# Patient Record
Sex: Male | Born: 1985 | Race: Black or African American | Hispanic: No | Marital: Single | Smoking: Current some day smoker
Health system: Southern US, Community
[De-identification: ages and names within clinical notes are randomized; demographics above are authoritative.]

## PROBLEM LIST (undated history)

## (undated) DIAGNOSIS — I10 Essential (primary) hypertension: Secondary | ICD-10-CM

## (undated) HISTORY — PX: BACK SURGERY: SHX140

---

## 2005-09-27 ENCOUNTER — Emergency Department (HOSPITAL_COMMUNITY): Admission: EM | Admit: 2005-09-27 | Discharge: 2005-09-27 | Payer: Self-pay | Admitting: Emergency Medicine

## 2006-10-22 ENCOUNTER — Emergency Department (HOSPITAL_COMMUNITY): Admission: EM | Admit: 2006-10-22 | Discharge: 2006-10-22 | Payer: Self-pay | Admitting: Emergency Medicine

## 2008-09-04 ENCOUNTER — Emergency Department (HOSPITAL_COMMUNITY): Admission: EM | Admit: 2008-09-04 | Discharge: 2008-09-04 | Payer: Self-pay | Admitting: Emergency Medicine

## 2008-09-21 DIAGNOSIS — F411 Generalized anxiety disorder: Secondary | ICD-10-CM | POA: Insufficient documentation

## 2008-09-21 DIAGNOSIS — R002 Palpitations: Secondary | ICD-10-CM

## 2008-09-29 ENCOUNTER — Encounter (INDEPENDENT_AMBULATORY_CARE_PROVIDER_SITE_OTHER): Payer: Self-pay | Admitting: *Deleted

## 2008-10-26 ENCOUNTER — Encounter (INDEPENDENT_AMBULATORY_CARE_PROVIDER_SITE_OTHER): Payer: Self-pay | Admitting: *Deleted

## 2010-05-26 LAB — CBC
MCHC: 34.2 g/dL (ref 30.0–36.0)
Platelets: 185 10*3/uL (ref 150–400)
RDW: 12.6 % (ref 11.5–15.5)

## 2010-05-26 LAB — POCT CARDIAC MARKERS
CKMB, poc: <1 ng/mL — ABNORMAL LOW (ref 1.0–8.0)
Myoglobin, poc: 54.7 ng/mL (ref 12–200)
Troponin i, poc: <0.05 ng/mL (ref 0.00–0.09)

## 2010-05-26 LAB — POCT I-STAT, CHEM 8
BUN: 9 mg/dL (ref 6–23)
Calcium, Ion: 1.27 mmol/L (ref 1.12–1.32)
Chloride: 107 meq/L (ref 96–112)
Creatinine, Ser: 1.1 mg/dL (ref 0.4–1.5)
Glucose, Bld: 87 mg/dL (ref 70–99)
HCT: 49 % (ref 39.0–52.0)
Hemoglobin: 16.7 g/dL (ref 13.0–17.0)
Potassium: 3.9 meq/L (ref 3.5–5.1)
Sodium: 142 meq/L (ref 135–145)
TCO2: 26 mmol/L (ref 0–100)

## 2010-05-26 LAB — DIFFERENTIAL
Basophils Absolute: 0 K/uL (ref 0.0–0.1)
Basophils Relative: 0 % (ref 0–1)
Eosinophils Absolute: 0.1 K/uL (ref 0.0–0.7)
Eosinophils Relative: 1 % (ref 0–5)
Lymphocytes Relative: 20 % (ref 12–46)
Lymphs Abs: 1.8 K/uL (ref 0.7–4.0)
Monocytes Absolute: 0.5 K/uL (ref 0.1–1.0)
Monocytes Relative: 6 % (ref 3–12)
Neutro Abs: 6.6 K/uL (ref 1.7–7.7)
Neutrophils Relative %: 73 % (ref 43–77)

## 2013-08-11 ENCOUNTER — Encounter (HOSPITAL_COMMUNITY): Payer: Self-pay | Admitting: Emergency Medicine

## 2013-08-11 ENCOUNTER — Emergency Department (HOSPITAL_COMMUNITY)
Admission: EM | Admit: 2013-08-11 | Discharge: 2013-08-11 | Disposition: A | Discharge status: Home / Self Care | Payer: Medicaid Other | Source: Home / Self Care | Attending: Family Medicine | Admitting: Family Medicine

## 2013-08-11 DIAGNOSIS — G8929 Other chronic pain: Secondary | ICD-10-CM

## 2013-08-11 DIAGNOSIS — M5432 Sciatica, left side: Secondary | ICD-10-CM

## 2013-08-11 DIAGNOSIS — M5430 Sciatica, unspecified side: Secondary | ICD-10-CM

## 2013-08-11 DIAGNOSIS — M549 Dorsalgia, unspecified: Secondary | ICD-10-CM

## 2013-08-11 LAB — POCT I-STAT, CHEM 8
BUN: 12 mg/dL (ref 6–23)
CHLORIDE: 103 meq/L (ref 96–112)
CREATININE: 0.9 mg/dL (ref 0.50–1.35)
Calcium, Ion: 1.24 mmol/L — ABNORMAL HIGH (ref 1.12–1.23)
GLUCOSE: 93 mg/dL (ref 70–99)
HEMATOCRIT: 50 % (ref 39.0–52.0)
Hemoglobin: 17 g/dL (ref 13.0–17.0)
POTASSIUM: 3.9 meq/L (ref 3.7–5.3)
SODIUM: 142 meq/L (ref 137–147)
TCO2: 26 mmol/L (ref 0–100)

## 2013-08-11 MED ORDER — PREDNISONE 10 MG PO KIT: PACK | ORAL | Status: DC

## 2013-08-11 MED ORDER — LISINOPRIL 5 MG PO TABS: 5.0000 mg | ORAL_TABLET | Freq: Every day | ORAL | Status: DC

## 2013-08-11 MED ORDER — TRAMADOL HCL 50 MG PO TABS: 50.0000 mg | ORAL_TABLET | Freq: Two times a day (BID) | ORAL | Status: DC | PRN

## 2013-08-11 MED ORDER — CYCLOBENZAPRINE HCL 5 MG PO TABS: 5.0000 mg | ORAL_TABLET | Freq: Every evening | ORAL | Status: DC | PRN

## 2013-08-11 NOTE — ED Notes (Signed)
C/o back pain States does have hx of arthritis in his back States he has ran out of pain medication

## 2013-08-11 NOTE — Discharge Instructions (Signed)
Thank you for coming in today. You should return in about 1 month or follow up with a doctor in 1 month for lab recheck.  Follow up with a primary doctor for back pain.  Come back or go to the emergency room if you notice new weakness new numbness problems walking or bowel or bladder problems.   Sciatica Sciatica is pain, weakness, numbness, or tingling along the path of the sciatic nerve. The nerve starts in the lower back and runs down the back of each leg. The nerve controls the muscles in the lower leg and in the back of the knee, while also providing sensation to the back of the thigh, lower leg, and the sole of your foot. Sciatica is a symptom of another medical condition. For instance, nerve damage or certain conditions, such as a herniated disk or bone spur on the spine, pinch or put pressure on the sciatic nerve. This causes the pain, weakness, or other sensations normally associated with sciatica. Generally, sciatica only affects one side of the body. CAUSES   Herniated or slipped disc.  Degenerative disk disease.  A pain disorder involving the narrow muscle in the buttocks (piriformis syndrome).  Pelvic injury or fracture.  Pregnancy.  Tumor (rare). SYMPTOMS  Symptoms can vary from mild to very severe. The symptoms usually travel from the low back to the buttocks and down the back of the leg. Symptoms can include:  Mild tingling or dull aches in the lower back, leg, or hip.  Numbness in the back of the calf or sole of the foot.  Burning sensations in the lower back, leg, or hip.  Sharp pains in the lower back, leg, or hip.  Leg weakness.  Severe back pain inhibiting movement. These symptoms may get worse with coughing, sneezing, laughing, or prolonged sitting or standing. Also, being overweight may worsen symptoms. DIAGNOSIS  Your caregiver will perform a physical exam to look for common symptoms of sciatica. He or she may ask you to do certain movements or activities  that would trigger sciatic nerve pain. Other tests may be performed to find the cause of the sciatica. These may include:  Blood tests.  X-rays.  Imaging tests, such as an MRI or CT scan. TREATMENT  Treatment is directed at the cause of the sciatic pain. Sometimes, treatment is not necessary and the pain and discomfort goes away on its own. If treatment is needed, your caregiver may suggest:  Over-the-counter medicines to relieve pain.  Prescription medicines, such as anti-inflammatory medicine, muscle relaxants, or narcotics.  Applying heat or ice to the painful area.  Steroid injections to lessen pain, irritation, and inflammation around the nerve.  Reducing activity during periods of pain.  Exercising and stretching to strengthen your abdomen and improve flexibility of your spine. Your caregiver may suggest losing weight if the extra weight makes the back pain worse.  Physical therapy.  Surgery to eliminate what is pressing or pinching the nerve, such as a bone spur or part of a herniated disk. HOME CARE INSTRUCTIONS   Only take over-the-counter or prescription medicines for pain or discomfort as directed by your caregiver.  Apply ice to the affected area for 20 minutes, 3-4 times a day for the first 48-72 hours. Then try heat in the same way.  Exercise, stretch, or perform your usual activities if these do not aggravate your pain.  Attend physical therapy sessions as directed by your caregiver.  Keep all follow-up appointments as directed by your caregiver.  Do not wear high heels or shoes that do not provide proper support.  Check your mattress to see if it is too soft. A firm mattress may lessen your pain and discomfort. SEEK IMMEDIATE MEDICAL CARE IF:   You lose control of your bowel or bladder (incontinence).  You have increasing weakness in the lower back, pelvis, buttocks, or legs.  You have redness or swelling of your back.  You have a burning sensation when  you urinate.  You have pain that gets worse when you lie down or awakens you at night.  Your pain is worse than you have experienced in the past.  Your pain is lasting longer than 4 weeks.  You are suddenly losing weight without reason. MAKE SURE YOU:  Understand these instructions.  Will watch your condition.  Will get help right away if you are not doing well or get worse. Document Released: 01/28/2001 Document Revised: 08/05/2011 Document Reviewed: 06/15/2011 [facility] Patient Information 2015 [place], [place]. This information is not intended to replace advice given to you by your health care provider. Make sure you discuss any questions you have with your health care provider.

## 2013-08-11 NOTE — ED Provider Notes (Signed)
Lee Garcia is a 28 y.o. male who presents to Urgent Care today for chronic back pain and hypertension. Patient moved from [place] recently. He recently obtained Medicaid and is here today to address his back pain. He notes chronic low back pain with radiation to his left leg. He typically has taken his tramadol for this in the past. He denies any weakness numbness bowel bladder dysfunction or difficulty walking.  Additionally patient notes elevated blood pressure. He was on blood pressure medication at home but currently is not taking any. No chest pain palpitations or shortness of breath   History reviewed. No pertinent past medical history. History  Substance Use Topics  . Smoking status: Not on file  . Smokeless tobacco: Not on file  . Alcohol Use: Not on file   ROS as above Medications: No current facility-administered medications for this encounter.   Current Outpatient Prescriptions  Medication Sig Dispense Refill  . cyclobenzaprine (FLEXERIL) 5 MG tablet Take 1 tablet (5 mg total) by mouth at bedtime as needed for muscle spasms.  20 tablet  0  . lisinopril (PRINIVIL,ZESTRIL) 5 MG tablet Take 1 tablet (5 mg total) by mouth daily.  30 tablet  1  . PredniSONE 10 MG KIT 12 day dose pack po  1 kit  0  . traMADol (ULTRAM) 50 MG tablet Take 1 tablet (50 mg total) by mouth every 12 (twelve) hours as needed for severe pain.  30 tablet  0    Exam:  BP 139/88  Pulse 68  Temp(Src) 97.8 F (36.6 C) (Oral)  Resp 18  SpO2 98% Gen: Well NAD HEENT: EOMI,  MMM Lungs: Normal work of breathing. CTABL Heart: RRR no MRG Abd: NABS, Soft. NT, ND Exts: Brisk capillary refill, warm and well perfused.  Back: Nontender to spinal midline. Tender palpation left SI joint. Negative straight leg raise test positive Corky Sox test on the left. Range of motion is intact. Reflexes are intact bilaterally. Normal strength sensation. Normal gait.  Results for orders placed during the hospital encounter  of 08/11/13 (from the past 24 hour(s))  POCT I-STAT, CHEM 8     Status: Abnormal   Collection Time    08/11/13 11:22 AM      Result Value Ref Range   Sodium 142  137 - 147 mEq/L   Potassium 3.9  3.7 - 5.3 mEq/L   Chloride 103  96 - 112 mEq/L   BUN 12  6 - 23 mg/dL   Creatinine, Ser 0.90  0.50 - 1.35 mg/dL   Glucose, Bld 93  70 - 99 mg/dL   Calcium, Ion 1.24 (*) 1.12 - 1.23 mmol/L   TCO2 26  0 - 100 mmol/L   Hemoglobin 17.0  13.0 - 17.0 g/dL   HCT 50.0  39.0 - 52.0 %   No results found.  Assessment and Plan: 28 y.o. male with  1) left-sided lumbago with mild sciatica. Neurologically intact. Plan to treat with prednisone and small amount of tramadol. Encourage followup with primary care provider through [facility]. 2) hypertension: Start low-dose lisinopril. Return to clinic in one month for recheck  Discussed warning signs or symptoms. Please see discharge instructions. Patient expresses understanding.    Gregor Hams, MD 08/11/13 [phone]

## 2013-10-07 ENCOUNTER — Emergency Department (HOSPITAL_COMMUNITY)
Admission: EM | Admit: 2013-10-07 | Discharge: 2013-10-07 | Disposition: A | Discharge status: Home / Self Care | Payer: Medicaid Other | Source: Home / Self Care | Attending: Family Medicine | Admitting: Family Medicine

## 2013-10-07 ENCOUNTER — Encounter (HOSPITAL_COMMUNITY): Payer: Self-pay | Admitting: Emergency Medicine

## 2013-10-07 DIAGNOSIS — I10 Essential (primary) hypertension: Secondary | ICD-10-CM | POA: Diagnosis present

## 2013-10-07 DIAGNOSIS — M545 Low back pain, unspecified: Secondary | ICD-10-CM | POA: Diagnosis present

## 2013-10-07 HISTORY — DX: Essential (primary) hypertension: I10

## 2013-10-07 MED ORDER — HYDROCHLOROTHIAZIDE 12.5 MG PO TABS: 12.5000 mg | ORAL_TABLET | Freq: Every day | ORAL | Status: DC

## 2013-10-07 MED ORDER — DICLOFENAC SODIUM 50 MG PO TBEC: 50.0000 mg | DELAYED_RELEASE_TABLET | Freq: Two times a day (BID) | ORAL | Status: DC | PRN

## 2013-10-07 NOTE — ED Notes (Signed)
Evaluated by physician only

## 2013-10-07 NOTE — Discharge Instructions (Signed)
Thank you for coming in today. Follow up with Dr. Farris Has at [facility]. Follow up with a primary care Dr. Claudie Fisherman back or go to the emergency room if you notice new weakness new numbness problems walking or bowel or bladder problems. PRIMARY CARE Merchant navy officer at [employer] [address]  [place], [place] [phone]  Fax [phone]  Nature conservation officer at [facility] [address]  [place], [place] [phone]  Fax [phone]  Nature conservation officer at [place] / [place] [phone] [place], [place] [phone]  Fax [phone]  [facility] [address]  [place], [place] [phone]  Fax [phone]  [facility] [address] [place] [address] [place], [place] [phone]  Fax [phone]  [facility] [address] [place], [place] [phone]  Fax [phone]   [facility] @ [place] [address] [place] [address] Phone: [phone]   [facility] @ [facility] [address] [place] [address] Phone: [phone]   [facility] @ [place] [address] [place] [address] Phone: [phone]   [facility] @ [place] [address] [place] [address] Phone: [phone]   [facility] @ [place] [address] [place] [address] Phone: [phone] Fax: [phone]   [facility] @ [place] [address] [place] [address] Phone: [phone]     Back Exercises These exercises may help you when beginning to rehabilitate your injury. Your symptoms may resolve with or without further involvement from your physician, physical therapist or athletic trainer. While completing these  exercises, remember:   Restoring tissue flexibility helps normal motion to return to the joints. This allows healthier, less painful movement and activity.  An effective stretch should be held for at least 30 seconds.  A stretch should never be painful. You should only feel a gentle lengthening or release in the stretched tissue. STRETCH - Extension, Prone on Elbows   Lie on your stomach on the floor, a bed will be too soft. Place your palms about shoulder width apart and at the height of your head.  Place your elbows under your shoulders. If this is too painful, stack pillows under your chest.  Allow your body to relax so that your hips drop lower and make contact more completely with the floor.  Hold this position for __________ seconds.  Slowly return to lying flat on the floor. Repeat __________ times. Complete this exercise __________ times per day.  RANGE OF MOTION - Extension, Prone Press Ups   Lie on your stomach on the floor, a bed will be too soft. Place your palms about shoulder width apart and at the height of your head.  Keeping your back as relaxed as possible, slowly straighten your elbows while keeping your hips on the floor. You may adjust the placement of your hands to maximize your comfort. As you gain motion, your hands will come more underneath your shoulders.  Hold this position __________ seconds.  Slowly return to lying flat on the floor. Repeat __________ times. Complete this exercise __________ times per day.  RANGE OF MOTION- Quadruped, Neutral Spine   Assume a hands and knees position on a firm  surface. Keep your hands under your shoulders and your knees under your hips. You may place padding under your knees for comfort.  Drop your head and point your tail bone toward the ground below you. This will round out your low back like an angry cat. Hold this position for __________ seconds.  Slowly lift your head and release your tail bone so that your back  sags into a large arch, like an old horse.  Hold this position for __________ seconds.  Repeat this until you feel limber in your low back.  Now, find your "sweet spot." This will be the most comfortable position somewhere between the two previous positions. This is your neutral spine. Once you have found this position, tense your stomach muscles to support your low back.  Hold this position for __________ seconds. Repeat __________ times. Complete this exercise __________ times per day.  STRETCH - Flexion, Single Knee to Chest   Lie on a firm bed or floor with both legs extended in front of you.  Keeping one leg in contact with the floor, bring your opposite knee to your chest. Hold your leg in place by either grabbing behind your thigh or at your knee.  Pull until you feel a gentle stretch in your low back. Hold __________ seconds.  Slowly release your grasp and repeat the exercise with the opposite side. Repeat __________ times. Complete this exercise __________ times per day.  STRETCH - Hamstrings, Standing  Stand or sit and extend your right / left leg, placing your foot on a chair or foot stool  Keeping a slight arch in your low back and your hips straight forward.  Lead with your chest and lean forward at the waist until you feel a gentle stretch in the back of your right / left knee or thigh. (When done correctly, this exercise requires leaning only a small distance.)  Hold this position for __________ seconds. Repeat __________ times. Complete this stretch __________ times per day. STRENGTHENING - Deep Abdominals, Pelvic Tilt   Lie on a firm bed or floor. Keeping your legs in front of you, bend your knees so they are both pointed toward the ceiling and your feet are flat on the floor.  Tense your lower abdominal muscles to press your low back into the floor. This motion will rotate your pelvis so that your tail bone is scooping upwards rather than pointing at your feet or  into the floor.  With a gentle tension and even breathing, hold this position for __________ seconds. Repeat __________ times. Complete this exercise __________ times per day.  STRENGTHENING - Abdominals, Crunches   Lie on a firm bed or floor. Keeping your legs in front of you, bend your knees so they are both pointed toward the ceiling and your feet are flat on the floor. Cross your arms over your chest.  Slightly tip your chin down without bending your neck.  Tense your abdominals and slowly lift your trunk high enough to just clear your shoulder blades. Lifting higher can put excessive stress on the low back and does not further strengthen your abdominal muscles.  Control your return to the starting position. Repeat __________ times. Complete this exercise __________ times per day.  STRENGTHENING - Quadruped, Opposite UE/LE Lift   Assume a hands and knees position on a firm surface. Keep your hands under your shoulders and your knees under your hips. You may place padding under your knees for comfort.  Find your neutral spine and gently  tense your abdominal muscles so that you can maintain this position. Your shoulders and hips should form a rectangle that is parallel with the floor and is not twisted.  Keeping your trunk steady, lift your right hand no higher than your shoulder and then your left leg no higher than your hip. Make sure you are not holding your breath. Hold this position __________ seconds.  Continuing to keep your abdominal muscles tense and your back steady, slowly return to your starting position. Repeat with the opposite arm and leg. Repeat __________ times. Complete this exercise __________ times per day. Document Released: 02/21/2005 Document Revised: 04/28/2011 Document Reviewed: 05/18/2008 [facility] Patient Information 2015 [place], [place]. This information is not intended to replace advice given to you by your health care provider. Make sure you discuss any  questions you have with your health care provider.

## 2013-10-07 NOTE — ED Provider Notes (Signed)
Lee Garcia is a 28 y.o. male who presents to Urgent Care today for back pain. Patient has chronic back pain. This is been unchanged for months. He has not yet established with a primary care provider or pain specialist for this issue. He denies any significant radiating pain weakness or numbness. He's tried some over-the-counter medications which helps some.  Additionally patient notes continued elevated blood pressure. He did not take the prescribed lisinopril previously as it caused some mild dizziness. He feels well otherwise. No chest pains palpitations or shortness of breath.   Past Medical History  Diagnosis Date  . Hypertension    History  Substance Use Topics  . Smoking status: Not on file  . Smokeless tobacco: Not on file  . Alcohol Use: Not on file   ROS as above Medications: No current facility-administered medications for this encounter.   Current Outpatient Prescriptions  Medication Sig Dispense Refill  . diclofenac (VOLTAREN) 50 MG EC tablet Take 1 tablet (50 mg total) by mouth 2 (two) times daily as needed.  60 tablet  0  . hydrochlorothiazide (HYDRODIURIL) 12.5 MG tablet Take 1 tablet (12.5 mg total) by mouth daily.  30 tablet  0  . [DISCONTINUED] lisinopril (PRINIVIL,ZESTRIL) 5 MG tablet Take 1 tablet (5 mg total) by mouth daily.  30 tablet  1    Exam:  BP 162/95  Pulse 79  Temp(Src) 98.3 F (36.8 C) (Oral)  Resp 16  SpO2 97% Gen: Well NAD HEENT: EOMI,  MMM Lungs: Normal work of breathing. CTABL Heart: RRR no MRG Abd: NABS, Soft. Nondistended, Nontender Exts: Brisk capillary refill, warm and well perfused.  Back: Nontender spinal midline. Tender palpation left lumbar paraspinal. Full range of motion of all of spine. Motor strength reflexes and sensation are intact. Normal gait.  No results found for this or any previous visit (from the past 24 hour(s)). No results found.  Assessment and Plan: 28 y.o. male with  1) chronic low back pain:  Diclofenac. Unable to prescribe narcotics route followup with primary care provider.  2) hypertension: Trial of hydrochlorothiazide. Followup with primary care provider.  Discussed warning signs or symptoms. Please see discharge instructions. Patient expresses understanding.   This note was created using Conservation officer, historic buildingsDragon voice recognition software. Any transcription errors are unintended.    Rodolph BongEvan S Micky Sheller, MD 10/07/13 2114

## 2014-01-10 ENCOUNTER — Emergency Department (HOSPITAL_COMMUNITY)
Admission: EM | Admit: 2014-01-10 | Discharge: 2014-01-10 | Disposition: A | Discharge status: Home / Self Care | Payer: Medicaid Other | Attending: Emergency Medicine | Admitting: Emergency Medicine

## 2014-01-10 ENCOUNTER — Encounter (HOSPITAL_COMMUNITY): Payer: Self-pay | Admitting: Emergency Medicine

## 2014-01-10 DIAGNOSIS — I10 Essential (primary) hypertension: Secondary | ICD-10-CM | POA: Insufficient documentation

## 2014-01-10 DIAGNOSIS — Z720 Tobacco use: Secondary | ICD-10-CM | POA: Insufficient documentation

## 2014-01-10 DIAGNOSIS — K029 Dental caries, unspecified: Secondary | ICD-10-CM | POA: Diagnosis not present

## 2014-01-10 DIAGNOSIS — K088 Other specified disorders of teeth and supporting structures: Secondary | ICD-10-CM | POA: Diagnosis present

## 2014-01-10 DIAGNOSIS — K047 Periapical abscess without sinus: Secondary | ICD-10-CM

## 2014-01-10 DIAGNOSIS — Z79899 Other long term (current) drug therapy: Secondary | ICD-10-CM | POA: Insufficient documentation

## 2014-01-10 MED ORDER — PENICILLIN V POTASSIUM 500 MG PO TABS: 500.0000 mg | ORAL_TABLET | Freq: Four times a day (QID) | ORAL | Status: DC

## 2014-01-10 MED ORDER — PENICILLIN V POTASSIUM 250 MG PO TABS
500.0000 mg | ORAL_TABLET | Freq: Four times a day (QID) | ORAL | Status: DC
  Administered 2014-01-10: 500 mg via ORAL
  Filled 2014-01-10: qty 2

## 2014-01-10 MED ORDER — TRAMADOL HCL 50 MG PO TABS: 50.0000 mg | ORAL_TABLET | Freq: Four times a day (QID) | ORAL | Status: DC

## 2014-01-10 MED ORDER — TRAMADOL HCL 50 MG PO TABS
50.0000 mg | ORAL_TABLET | Freq: Once | ORAL | Status: AC
  Administered 2014-01-10: 50 mg via ORAL
  Filled 2014-01-10: qty 1

## 2014-01-10 NOTE — ED Notes (Signed)
The patient said his pain has been going on for a week now.  He thinks he has an abscessed tooth.  The patient said he cannot eat or sleep from the pain.

## 2014-01-10 NOTE — Discharge Instructions (Signed)
Abscessed Tooth °An abscessed tooth is an infection around your tooth. It may be caused by holes or damage to the tooth (cavity) or a dental disease. An abscessed tooth causes mild to very bad pain in and around the tooth. See your dentist right away if you have tooth or gum pain. °HOME CARE °· Take your medicine as told. Finish it even if you start to feel better. °· Do not drive after taking pain medicine. °· Rinse your mouth (gargle) often with salt water (¼ teaspoon salt in 8 ounces of warm water). °· Do not apply heat to the outside of your face. °GET HELP RIGHT AWAY IF:  °· You have a temperature by mouth above 102° F (38.9° C), not controlled by medicine. °· You have chills and a very bad headache. °· You have problems breathing or swallowing. °· Your mouth will not open. °· You develop puffiness (swelling) on the neck or around the eye. °· Your pain is not helped by medicine. °· Your pain is getting worse instead of better. °MAKE SURE YOU:  °· Understand these instructions. °· Will watch your condition. °· Will get help right away if you are not doing well or get worse. °Document Released: 07/23/2007 Document Revised: 04/28/2011 Document Reviewed: 05/14/2010 °ExitCare® Patient Information ©2015 [employer]. This information is not intended to replace advice given to you by your health care provider. Make sure you discuss any questions you have with your health care provider. ° °Emergency Department Resource Guide °1) Find a Doctor and Pay Out of Pocket °Although you won't have to find out who is covered by your insurance plan, it is a good idea to ask around and get recommendations. You will then need to call the office and see if the doctor you have chosen will accept you as a new patient and what types of options they offer for patients who are self-pay. Some doctors offer discounts or will set up payment plans for their patients who do not have insurance, but you will need to ask so you aren't surprised  when you get to your appointment. ° °2) Contact Your Local Health Department °Not all health departments have doctors that can see patients for sick visits, but many do, so it is worth a call to see if yours does. If you don't know where your local health department is, you can check in your phone book. The CDC also has a tool to help you locate your state's health department, and many state websites also have listings of all of their local health departments. ° °3) Find a Walk-in Clinic °If your illness is not likely to be very severe or complicated, you may want to try a walk in clinic. These are popping up all over the country in pharmacies, drugstores, and shopping centers. They're usually staffed by nurse practitioners or physician assistants that have been trained to treat common illnesses and complaints. They're usually fairly quick and inexpensive. However, if you have serious medical issues or chronic medical problems, these are probably not your best option. ° °No Primary Care Doctor: °- Call [facility] at  832-8000 - they can help you locate a primary care doctor that  accepts your insurance, provides certain services, etc. °- Physician Referral Service- 1-800-533-3463 ° °Chronic Pain Problems: °Organization         Address  Phone   Notes  °[facility]  (336) 297-2271 Patients need to be referred by their primary care doctor.  ° °  Medication Assistance: °Organization         Address  Phone   Notes  °[place] [facility] [address] °[place], [place] [address] (336) 641-8030 --Must be a resident of [place] °-- Must have NO insurance coverage whatsoever (no Medicaid/ Medicare, etc.) °-- The pt. MUST have a primary care doctor that directs their care regularly and follows them in the community °  °[facility]  (866) 331-1348   °[employer]  (888) 892-1162   ° °Agencies that provide inexpensive medical care: °Organization          Address  Phone   Notes  °[facility]  (336) 832-8035   °[facility]    (336) 832-7272   °[facility] [address] °[place], [place] [address] (336) 832-4777   °[facility] [address], °[place] (336) 271-4999   °[facility]    (336) 373-0678   °[facility]    (336) 272-1050   °[facility] ° [address], [place] Phone:  (336) 832-4444, Fax:  (336) 832-4440 Hours of Operation:  9 am - 6 pm, M-F.  Also accepts Medicaid/Medicare and self-pay.  °[facility] ° [address], [place] Phone: (336) 832-3150, Fax: (336) 832-3151. Hours of Operation:  8:30 am - 5:30 pm, M-F.  Also accepts Medicaid and self-pay.  °[facility] [place] [address], [place] Phone: (336) 878-6027   °[facility] [address], [place], [place] (336)723-1848, Ext. 123 Mondays & Thursdays: 7-9 AM.  First 15 patients are seen on a first come, first serve basis. °  ° °Medicaid-accepting [place] Providers: ° °Organization         Address  Phone   Notes  °[facility] [address], [place] (336) 641-2100 Also accepts self-pay patients.  °[facility] [address], [place] ° (336) 856-9996   °[facility] [address], [place] (336) 288-8857   °[facility] [address] [place] [address], [place] (336) 299-7000   °Veita Bland [address], [place]  ° (336) 373-1557 Only accepts [place] Access Medicaid patients after they have their name applied to their card.  ° °Self-Pay (no insurance) in [place]: ° °Organization         Address  Phone   Notes  °Sickle Cell Patients, [facility] [address], [place] (336) 832-1970   °[facility] [address], [place] (336) 832-4400    °[facility] ° [address], [place] (336) 992-4800   °[facility]/Dr. Osei-Bonsu ° [address] [place] [address], [place] or [address], [place] (336) 841-8500 Phone number for both [place] and [place] locations is the same.  °[facility] [address], [place] (336) 299-0000   °[facility] [address] [place] [address], [place] or [address] (336) 852-7530 °(336) 878-2260   °[facility] [address], [place] (336) 350-1642, phone; (336) 294-5005, fax Sees patients 1st and 3rd Saturday of every month.  Must not qualify for public or private insurance (i.e. Medicaid, Medicare, [place] Health Choice, Veterans' Benefits) • Household income should be no more than 200% of the poverty level •The   clinic cannot treat you if you are pregnant or think you are pregnant • Sexually transmitted diseases are not treated at the clinic.  ° ° °Dental Care: °Organization         Address  Phone  Notes  °[facility] [address], [place] (336) 641-6152 Accepts children up to age 21 who are enrolled in Medicaid or [place] Health Choice; pregnant women with a Medicaid card; and children who have applied for Medicaid or [place] Health Choice, but were declined, whose parents can pay a reduced fee at time of service.  °[facility] [place]  [address], [place] (336) 641-7733 Accepts children up to age 21 who are enrolled in Medicaid or [place] Health Choice; pregnant women with a Medicaid card; and children who have applied for Medicaid or [place] Health Choice, but were declined, whose parents can pay a reduced fee at time of service.  °[facility] ° [address], [place] (336) 641-4533 Patients are seen by appointment only. Walk-ins are not accepted. [facility] will see patients 18  years of age and older. °Monday - Tuesday (8am-5pm) °Most Wednesdays (8:30-5pm) °$30 per visit, cash only  °[facility] ° [address], [place] (336) 641-4533 Patients are seen by appointment only. Walk-ins are not accepted. [facility] will see patients 18 years of age and older. °One Wednesday Evening (Monthly: Volunteer Based).  $30 per visit, cash only  °[facility]  (919) 537-3737 for adults; Children under age 4, call Graduate Pediatric Dentistry at (919) [phone]. Children aged 4-14, please call (919) [phone] to request a pediatric application. ° Dental services are provided in all areas of dental care including fillings, crowns and bridges, complete and partial dentures, implants, gum treatment, root canals, and extractions. Preventive care is also provided. Treatment is provided to both adults and children. °Patients are selected via a lottery and there is often a waiting list. °  °[facility] [address], °[place] ° (336) 763-8833 [email or web address] °  °[facility] [address], [place], [place] (336)723-1848, Ext. 123 Second and Fourth Thursday of each month, opens at 6:30 AM; Clinic ends at 9 AM.  Patients are seen on a first-come first-served basis, and a limited number are seen during each clinic.  ° °[facility] ° [address], [place], [place] (336) 723-7904   Eligibility Requirements °You must have lived in [place], [place], or [place] counties for at least the last three months. °  You cannot be eligible for state or federal sponsored healthcare insurance, including Veterans Administration, Medicaid, or Medicare. °  You generally cannot be eligible for healthcare insurance through your employer.  °  How to apply: °Eligibility screenings are held every Tuesday and Wednesday afternoon from 1:00 pm until 4:00 pm. You do not need an appointment for the interview!  °[facility]  [address], [place], [place] 336-631-2330   °[facility]  336-342-8273   °[facility]  336-703-3100   °[facility]  336-570-6415   ° °Behavioral Health Resources in the Community: °Intensive Outpatient Programs °Organization         Address  Phone  Notes  °[facility] [address], [place], [place] 336-878-6098   °[facility] [address], ,   [place] 336-832-9800   °[facility] [address], [place], [place] ° 336-882-2125   °[facility] [address],  °[place], [place] 1-800-853-5163 or 336-641-4981   °Substance Abuse Resources °Organization         Address  Phone  Notes  °[facility]  336-882-2125   °[facility]  336-784-9470   °[facility]  336-285-9073   °[facility]  336-845-3988   °Residential & Outpatient Substance Abuse Program  1-800-659-3381   °Psychological Services °Organization         Address  Phone  Notes  °[facility]  336- 832-9600   °[facility]  336- 378-7881   °[facility] [address], [place] 1-800-853-5163 or 336-641-4981   ° °Mobile Crisis Teams °Organization         Address  Phone  Notes  °[facility], Mobile Crisis Care Unit  1-877-626-1772   °Assertive °[facility] ° [address] [place], [place] 336-834-9664   °Sharon DeEsch [address] °[place] 336-554-5454   ° °Self-Help/Support Groups °Organization         Address  Phone             Notes  °[facility] - variety of support groups  336- 373-1402 Call for more information  °Narcotics Anonymous (NA), [facility] [address], °[place]  2 meetings at this location  ° °Residential Treatment Programs °Organization         Address  Phone  Notes  °[facility] [address],    °[place]   1-866-801-8205   °[facility] ° [address], [place], [place] 704-293-8524   °[facility] [address], [place] 336-845-3988 Admissions: 8am-3pm M-F  °[facility] [address]    °[place], [place] 336-841-1104   °[facility] [address], [place], [place] 336-379-7146   °[facility] [address]  °[place], [place] 336-285-9073   °[facility] Intensive Outpatient [address], [place], [place] 336-852-3033   °[facility] [address]  °[place], [place] 1-877-615-2722 or 336-784-9470   °[facility] [address] [place], [place] 336-227-7417 Accepts Medicaid  °[facility] [address]  °[place] 1-800-659-3381 Substance Abuse/Addiction Treatment  ° °[place] Behavioral Health Resources °Organization         Address  Phone  Notes  °[facility]  (888) 581-9988   °Julie Brannon, PhD [address] [place],    (336) 349-5553 or (336) 951-0000   °[facility]   [address] °[place],  (336) 349-4454   °[facility] [address], [place],  (336) 342-8316 Insurance/Medicaid/sponsorship through [facility]  °[facility] [address]                                    [place],  (336) 342-8316 Therapy/tele-psych/case  °[facility] [address]  ° [place],  (336) 349-2233    °Dr. Arfeen  (336) 349-4544   °[facility] [place]  [employer] [place] [facility] 1) [address], [place] °2) [address], [place] °3)  [address], [place] (336) 349-3220 °(336) 342-7768 ° °(  336) 342-8140   °[place] [facility] (336) 342-1394 or (336) 342-3537 (After Hours)    ° ° °

## 2014-01-10 NOTE — ED Provider Notes (Signed)
CSN: [id number]     Arrival date & time 01/10/14  1942 History   First MD Initiated Contact with Patient 01/10/14 2012     Chief Complaint  Patient presents with  . Dental Pain    The patient said his pain has been going on for a week now.  He thinks he has an abscessed tooth.       (Consider location/radiation/quality/duration/timing/severity/associated sxs/prior Treatment) HPI Comments: Patient with R lower last molar pain and swelling for a while worse over the past week . States has tried to call "some dentist" on his lunch hours without success   Patient is a 28 y.o. male presenting with tooth pain. The history is provided by the patient.  Dental Pain Location:  Lower Lower teeth location:  17/LL 3rd molar Quality:  Pulsating Severity:  Moderate Onset quality:  Gradual Timing:  Constant Progression:  Unchanged Chronicity:  Chronic Context: abscess and dental caries   Relieved by:  Nothing Worsened by:  Cold food/drink and pressure Associated symptoms: gum swelling   Associated symptoms: no facial pain and no facial swelling   Risk factors: lack of dental care     Past Medical History  Diagnosis Date  . Hypertension    Past Surgical History  Procedure Laterality Date  . Back surgery     History reviewed. No pertinent family history. History  Substance Use Topics  . Smoking status: Current Every Day Smoker -- 0.50 packs/day  . Smokeless tobacco: Never Used  . Alcohol Use: Yes    Review of Systems  HENT: Positive for dental problem. Negative for facial swelling.   Respiratory: Negative for shortness of breath.   Cardiovascular: Negative for chest pain.  All other systems reviewed and are negative.     Allergies  Review of patient's allergies indicates no known allergies.  Home Medications   Prior to Admission medications   Medication Sig Start Date End Date Taking? Authorizing Provider  diclofenac (VOLTAREN) 50 MG EC tablet Take 1 tablet (50 mg total)  by mouth 2 (two) times daily as needed. 10/07/13   Rodolph BongEvan S Corey, MD  hydrochlorothiazide (HYDRODIURIL) 12.5 MG tablet Take 1 tablet (12.5 mg total) by mouth daily. 10/07/13   Rodolph BongEvan S Corey, MD  penicillin v potassium (VEETID) 500 MG tablet Take 1 tablet (500 mg total) by mouth 4 (four) times daily. 01/10/14   Arman FilterGail K Garland Smouse, NP  traMADol (ULTRAM) 50 MG tablet Take 1 tablet (50 mg total) by mouth 4 (four) times daily. 01/10/14   Arman FilterGail K Damare Serano, NP   BP 169/104 mmHg  Pulse 110  Temp(Src) 98.7 F (37.1 C) (Oral)  Resp 17  Ht 5\' 5"  (1.651 m)  Wt 242 lb (109.77 kg)  BMI 40.27 kg/m2  SpO2 98% Physical Exam  Constitutional: He is oriented to person, place, and time. He appears well-developed and well-nourished.  HENT:  Head: Normocephalic.  Mouth/Throat:    Eyes: Pupils are equal, round, and reactive to light.  Neck: Normal range of motion.  Cardiovascular: Normal rate.   Pulmonary/Chest: Effort normal.  Musculoskeletal: Normal range of motion.  Neurological: He is alert and oriented to person, place, and time.  Skin: Skin is warm and dry.  Psychiatric: He has a normal mood and affect.  Nursing note and vitals reviewed.   ED Course  Procedures (including critical care time) Labs Review Labs Reviewed - No data to display  Imaging Review No results found.   EKG Interpretation None  MDM   Final diagnoses:  Dental decay  Dental abscess         Arman FilterGail K Angeli Demilio, NP 01/10/14 2033  Elwin MochaBlair Walden, MD 01/10/14 2356

## 2014-04-20 ENCOUNTER — Encounter (HOSPITAL_COMMUNITY): Payer: Self-pay | Admitting: Emergency Medicine

## 2014-04-20 ENCOUNTER — Emergency Department (INDEPENDENT_AMBULATORY_CARE_PROVIDER_SITE_OTHER)
Admission: EM | Admit: 2014-04-20 | Discharge: 2014-04-20 | Disposition: A | Discharge status: Home / Self Care | Payer: Self-pay | Source: Home / Self Care | Attending: Family Medicine | Admitting: Family Medicine

## 2014-04-20 DIAGNOSIS — K047 Periapical abscess without sinus: Secondary | ICD-10-CM

## 2014-04-20 DIAGNOSIS — K029 Dental caries, unspecified: Secondary | ICD-10-CM

## 2014-04-20 MED ORDER — KETOROLAC TROMETHAMINE 60 MG/2ML IM SOLN
INTRAMUSCULAR | Status: AC
  Filled 2014-04-20: qty 2

## 2014-04-20 MED ORDER — CLINDAMYCIN HCL 300 MG PO CAPS: 300.0000 mg | ORAL_CAPSULE | Freq: Three times a day (TID) | ORAL | Status: DC

## 2014-04-20 MED ORDER — KETOROLAC TROMETHAMINE 60 MG/2ML IM SOLN
60.0000 mg | Freq: Once | INTRAMUSCULAR | Status: AC
  Administered 2014-04-20: 60 mg via INTRAMUSCULAR

## 2014-04-20 NOTE — ED Notes (Signed)
Pain has improved, now 6/10

## 2014-04-20 NOTE — ED Provider Notes (Signed)
CSN: [id number]     Arrival date & time 04/20/14  1221 History   First MD Initiated Contact with Patient 04/20/14 1324     Chief Complaint  Patient presents with  . Dental Pain   (Consider location/radiation/quality/duration/timing/severity/associated sxs/prior Treatment) HPI  R lower molar broke off some time ago. Started becoming painful 3 days ago. Acutely worsened last night. Pain woke pt from sleep last night. Tylenol 1000mg  w/o benefit. Pain is constant and is radiating towards neck, temple and ear. Decreased PO due to pain. Worse w/ trying to eat. Denies fevers, CP, SOb, palpitations, HA, nausea, vomiting, nausea.   No longer taking HCTZ: denies CP, palpitations, SOB.    Past Medical History  Diagnosis Date  . Hypertension    Past Surgical History  Procedure Laterality Date  . Back surgery     Family History  Problem Relation Age of Onset  . Diabetes Mother   . Hypertension Mother   . Diabetes Father   . Hypertension Father    History  Substance Use Topics  . Smoking status: Current Every Day Smoker -- 0.25 packs/day  . Smokeless tobacco: Never Used  . Alcohol Use: Yes    Review of Systems  Allergies  Review of patient's allergies indicates no known allergies.  Home Medications   Prior to Admission medications   Medication Sig Start Date End Date Taking? Authorizing Provider  clindamycin (CLEOCIN) 300 MG capsule Take 1 capsule (300 mg total) by mouth 3 (three) times daily. 04/20/14   Ozella Rocksavid J Merrell, MD   BP 164/81 mmHg  Pulse 96  Temp(Src) 98.9 F (37.2 C) (Oral)  Resp 16  SpO2 98% Physical Exam  Constitutional: He appears well-developed and well-nourished.  HENT:  Numerous dental caries, right most posterior mandibular molar broken off at the level of the gums with surrounding swelling and tenderness to palpation. No purulence, no fluctuance. No significant facial swelling.  Eyes: EOM are normal. Pupils are equal, round, and reactive to light.  Neck:  Normal range of motion.  Cardiovascular: Normal rate and normal heart sounds.   No murmur heard. Pulmonary/Chest: Effort normal.  Abdominal: Soft. Bowel sounds are normal.  Musculoskeletal: Normal range of motion. He exhibits no edema or tenderness.  Neurological: He is alert.  Skin: Skin is dry.  Psychiatric: Judgment and thought content normal.    ED Course  Procedures (including critical care time) Labs Review Labs Reviewed - No data to display  Imaging Review No results found.   MDM   1. Dental infection   2. Infected dental carries    Toradol 60 mg IM given in office Start clindamycin 300 3 times a day Daily mouthwash Medicaid list of dentists in [place] provided to patient.  Patient counseled to continue checking blood pressure regularly as he'll likely need to go back onto blood pressure medication.    Precautions given and all questions answered   Shelly Flattenavid Merrell, MD Family Medicine 04/20/2014, 1:46 PM    Ozella Rocksavid J Merrell, MD 04/20/14 1346

## 2014-04-20 NOTE — Discharge Instructions (Signed)
You have a dental infection this will require antibiotics to clear Please call to follow up at a dentist Please take the antibiotics for the full 7 days Please start ibuprofen for pain in 24 hours Please continue tylenol as needed for further pain Please use a daily mouthwash

## 2014-04-20 NOTE — ED Notes (Signed)
Pain for 2 days , but significantly worse since last night.  Pain involves lower, right tooth, pain in jaw and right ear.  Patient knows tooth is broken.

## 2014-04-21 ENCOUNTER — Emergency Department (HOSPITAL_COMMUNITY)
Admission: EM | Admit: 2014-04-21 | Discharge: 2014-04-21 | Disposition: A | Discharge status: Home / Self Care | Payer: Self-pay | Attending: Emergency Medicine | Admitting: Emergency Medicine

## 2014-04-21 ENCOUNTER — Encounter (HOSPITAL_COMMUNITY): Payer: Self-pay | Admitting: Family Medicine

## 2014-04-21 DIAGNOSIS — K029 Dental caries, unspecified: Secondary | ICD-10-CM | POA: Insufficient documentation

## 2014-04-21 DIAGNOSIS — K002 Abnormalities of size and form of teeth: Secondary | ICD-10-CM | POA: Insufficient documentation

## 2014-04-21 DIAGNOSIS — Z792 Long term (current) use of antibiotics: Secondary | ICD-10-CM | POA: Insufficient documentation

## 2014-04-21 DIAGNOSIS — I10 Essential (primary) hypertension: Secondary | ICD-10-CM | POA: Insufficient documentation

## 2014-04-21 DIAGNOSIS — Z720 Tobacco use: Secondary | ICD-10-CM | POA: Insufficient documentation

## 2014-04-21 DIAGNOSIS — K047 Periapical abscess without sinus: Secondary | ICD-10-CM | POA: Insufficient documentation

## 2014-04-21 LAB — COMPREHENSIVE METABOLIC PANEL
ALK PHOS: 89 U/L (ref 39–117)
ALT: 26 U/L (ref 0–53)
ANION GAP: 11 (ref 5–15)
AST: 25 U/L (ref 0–37)
Albumin: 4.1 g/dL (ref 3.5–5.2)
BILIRUBIN TOTAL: 0.5 mg/dL (ref 0.3–1.2)
BUN: 9 mg/dL (ref 6–23)
CHLORIDE: 103 mmol/L (ref 96–112)
CO2: 26 mmol/L (ref 19–32)
Calcium: 9.6 mg/dL (ref 8.4–10.5)
Creatinine, Ser: 0.99 mg/dL (ref 0.50–1.35)
GLUCOSE: 104 mg/dL — AB (ref 70–99)
POTASSIUM: 3.9 mmol/L (ref 3.5–5.1)
Sodium: 140 mmol/L (ref 135–145)
TOTAL PROTEIN: 7 g/dL (ref 6.0–8.3)

## 2014-04-21 LAB — CBC WITH DIFFERENTIAL/PLATELET
BASOS ABS: 0 10*3/uL (ref 0.0–0.1)
BASOS PCT: 0 % (ref 0–1)
EOS ABS: 0.1 10*3/uL (ref 0.0–0.7)
Eosinophils Relative: 1 % (ref 0–5)
HCT: 45.3 % (ref 39.0–52.0)
Hemoglobin: 15.1 g/dL (ref 13.0–17.0)
Lymphocytes Relative: 24 % (ref 12–46)
Lymphs Abs: 2.6 10*3/uL (ref 0.7–4.0)
MCH: 29.2 pg (ref 26.0–34.0)
MCHC: 33.3 g/dL (ref 30.0–36.0)
MCV: 87.5 fL (ref 78.0–100.0)
Monocytes Absolute: 0.7 10*3/uL (ref 0.1–1.0)
Monocytes Relative: 6 % (ref 3–12)
NEUTROS ABS: 7.7 10*3/uL (ref 1.7–7.7)
NEUTROS PCT: 69 % (ref 43–77)
PLATELETS: 222 10*3/uL (ref 150–400)
RBC: 5.18 MIL/uL (ref 4.22–5.81)
RDW: 13 % (ref 11.5–15.5)
WBC: 11.2 10*3/uL — ABNORMAL HIGH (ref 4.0–10.5)

## 2014-04-21 MED ORDER — HYDROCODONE-ACETAMINOPHEN 5-325 MG PO TABS: 1.0000 | ORAL_TABLET | ORAL | Status: DC | PRN

## 2014-04-21 MED ORDER — KETOROLAC TROMETHAMINE 30 MG/ML IJ SOLN
30.0000 mg | Freq: Once | INTRAMUSCULAR | Status: AC
  Administered 2014-04-21: 30 mg via INTRAVENOUS
  Filled 2014-04-21: qty 1

## 2014-04-21 MED ORDER — TRAMADOL HCL 50 MG PO TABS: 50.0000 mg | ORAL_TABLET | Freq: Four times a day (QID) | ORAL | Status: DC | PRN

## 2014-04-21 MED ORDER — CLINDAMYCIN PHOSPHATE 600 MG/50ML IV SOLN
600.0000 mg | Freq: Once | INTRAVENOUS | Status: AC
  Administered 2014-04-21: 600 mg via INTRAVENOUS
  Filled 2014-04-21: qty 50

## 2014-04-21 MED ORDER — SODIUM CHLORIDE 0.9 % IV BOLUS (SEPSIS)
1000.0000 mL | Freq: Once | INTRAVENOUS | Status: AC
Start: 2014-04-21 — End: 2014-04-21
  Administered 2014-04-21: 1000 mL via INTRAVENOUS

## 2014-04-21 NOTE — ED Notes (Signed)
Pt here for worsening dental pain and abscess after being in abx x 2 days. Pt tach at triage around 130. sts he has a pocket in mouth with white stuff and neck swelling.
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
## 2014-04-21 NOTE — Discharge Instructions (Signed)
Dental Abscess °A dental abscess is a collection of infected fluid (pus) from a bacterial infection in the inner part of the tooth (pulp). It usually occurs at the end of the tooth's root.  °CAUSES  °· Severe tooth decay. °· Trauma to the tooth that allows bacteria to enter into the pulp, such as a broken or chipped tooth. °SYMPTOMS  °· Severe pain in and around the infected tooth. °· Swelling and redness around the abscessed tooth or in the mouth or face. °· Tenderness. °· Pus drainage. °· Bad breath. °· Bitter taste in the mouth. °· Difficulty swallowing. °· Difficulty opening the mouth. °· Nausea. °· Vomiting. °· Chills. °· Swollen neck glands. °DIAGNOSIS  °· A medical and dental history will be taken. °· An examination will be performed by tapping on the abscessed tooth. °· X-rays may be taken of the tooth to identify the abscess. °TREATMENT °The goal of treatment is to eliminate the infection. You may be prescribed antibiotic medicine to stop the infection from spreading. A root canal may be performed to save the tooth. If the tooth cannot be saved, it may be pulled (extracted) and the abscess may be drained.  °HOME CARE INSTRUCTIONS °· Only take over-the-counter or prescription medicines for pain, fever, or discomfort as directed by your caregiver. °· Rinse your mouth (gargle) often with salt water (¼ tsp salt in 8 oz [250 ml] of warm water) to relieve pain or swelling. °· Do not drive after taking pain medicine (narcotics). °· Do not apply heat to the outside of your face. °· Return to your dentist for further treatment as directed. °SEEK MEDICAL CARE IF: °· Your pain is not helped by medicine. °· Your pain is getting worse instead of better. °SEEK IMMEDIATE MEDICAL CARE IF: °· You have a fever or persistent symptoms for more than 2-3 days. °· You have a fever and your symptoms suddenly get worse. °· You have chills or a very bad headache. °· You have problems breathing or swallowing. °· You have trouble  opening your mouth. °· You have swelling in the neck or around the eye. °Document Released: 02/03/2005 Document Revised: 10/29/2011 Document Reviewed: 05/14/2010 °ExitCare® Patient Information ©2015 [employer]. This information is not intended to replace advice given to you by your health care provider. Make sure you discuss any questions you have with your health care provider. ° ° °Emergency Department Resource Guide °1) Find a Doctor and Pay Out of Pocket °Although you won't have to find out who is covered by your insurance plan, it is a good idea to ask around and get recommendations. You will then need to call the office and see if the doctor you have chosen will accept you as a new patient and what types of options they offer for patients who are self-pay. Some doctors offer discounts or will set up payment plans for their patients who do not have insurance, but you will need to ask so you aren't surprised when you get to your appointment. ° °2) Contact Your Local Health Department °Not all health departments have doctors that can see patients for sick visits, but many do, so it is worth a call to see if yours does. If you don't know where your local health department is, you can check in your phone book. The CDC also has a tool to help you locate your state's health department, and many state websites also have listings of all of their local health departments. ° °3) Find a Walk-in   Clinic °If your illness is not likely to be very severe or complicated, you may want to try a walk in clinic. These are popping up all over the country in pharmacies, drugstores, and shopping centers. They're usually staffed by nurse practitioners or physician assistants that have been trained to treat common illnesses and complaints. They're usually fairly quick and inexpensive. However, if you have serious medical issues or chronic medical problems, these are probably not your best option. ° °No Primary Care Doctor: °- Call  [facility] at  832-8000 - they can help you locate a primary care doctor that  accepts your insurance, provides certain services, etc. °- Physician Referral Service- 1-800-533-3463 ° °Chronic Pain Problems: °Organization         Address  Phone   Notes  °[facility]  (336) 297-2271 Patients need to be referred by their primary care doctor.  ° °Medication Assistance: °Organization         Address  Phone   Notes  °[place] [facility] [address] °[place], [place] [address] (336) 641-8030 --Must be a resident of [place] °-- Must have NO insurance coverage whatsoever (no Medicaid/ Medicare, etc.) °-- The pt. MUST have a primary care doctor that directs their care regularly and follows them in the community °  °[facility]  (866) 331-1348   °[employer]  (888) 892-1162   ° °Agencies that provide inexpensive medical care: °Organization         Address  Phone   Notes  °[facility]  (336) 832-8035   °[facility]    (336) 832-7272   °[facility] [address] °[place], [place] [address] (336) 832-4777   °[facility] [address], °[place] (336) 271-4999   °[facility]    (336) 373-0678   °[facility]    (336) 272-1050   °[facility] ° [address], [place] Phone:  (336) 832-4444, Fax:  (336) 832-4440 Hours of Operation:  9 am - 6 pm, M-F.  Also accepts Medicaid/Medicare and self-pay.  °[facility] ° [address], [place] Phone: (336) 832-3150, Fax: (336) 832-3151. Hours of Operation:  8:30 am - 5:30 pm, M-F.  Also accepts Medicaid and self-pay.  °[facility] [place] [address], [place] Phone: (336) 878-6027   °[facility] [address], [place], [place] (336)723-1848, Ext. 123 Mondays & Thursdays: 7-9 AM.  First 15 patients are seen on a first come, first serve  basis. °  ° °Medicaid-accepting [place] Providers: ° °Organization         Address  Phone   Notes  °[facility] [address], [place] (336) 641-2100 Also accepts self-pay patients.  °[facility] [address], [place] ° (336) 856-9996   °[facility] [address], [place] (336) 288-8857   °[facility] [address] [place] [address], [place] (336) 299-7000   °Veita Bland [address], [place]  ° (336) 373-1557 Only accepts [place] Access Medicaid patients after they have their name applied to their card.  ° °Self-Pay (no insurance) in [place]: ° °Organization         Address  Phone   Notes  °Sickle Cell Patients, [facility] [address], [place] (  336) 832-1970   °[facility] [address], [place] (336) 832-4400   °[facility] ° [address],  (336) 992-4800   °[facility]/Dr. Osei-Bonsu ° [address] [place] [address], [place] or [address], [place] (336) 841-8500 Phone number for both [place] and [place] locations is the same.  °[facility] [address], [place] (336) 299-0000   °[facility] [address] [place] [address], [place] or [address] (336) 852-7530 °(336) 878-2260   °[facility] [address], [place] (336) 350-1642, phone; (336) 294-5005, fax Sees patients 1st and 3rd Saturday of every month.  Must not qualify for public or private insurance (i.e. Medicaid, Medicare, [place] Health Choice, Veterans' Benefits) • Household income should be no more than 200% of the poverty level •The clinic cannot treat you if you are pregnant or think you are pregnant • Sexually transmitted diseases are not treated at the clinic.  ° ° °Dental Care: °Organization         Address  Phone  Notes  °[place]  [facility] [address], [place] (336) 641-6152 Accepts children up to age 21 who are enrolled in Medicaid or [place] Health Choice; pregnant women with a Medicaid card; and children who have applied for Medicaid or [place] Health Choice, but were declined, whose parents can pay a reduced fee at time of service.  °[facility] [place]  [address], [place] (336) 641-7733 Accepts children up to age 21 who are enrolled in Medicaid or [place] Health Choice; pregnant women with a Medicaid card; and children who have applied for Medicaid or [place] Health Choice, but were declined, whose parents can pay a reduced fee at time of service.  °[facility] ° [address], [place] (336) 641-4533 Patients are seen by appointment only. Walk-ins are not accepted. [facility] will see patients 18 years of age and older. °Monday - Tuesday (8am-5pm) °Most Wednesdays (8:30-5pm) °$30 per visit, cash only  °[facility] ° [address], [place] (336) 641-4533 Patients are seen by appointment only. Walk-ins are not accepted. [facility] will see patients 18 years of age and older. °One Wednesday Evening (Monthly: Volunteer Based).  $30 per visit, cash only  °[facility]  (919) 537-3737 for adults; Children under age 4, call Graduate Pediatric Dentistry at ([phone]) [phone]. Children aged 4-14, please call (919) [phone] to request a pediatric application. ° Dental services are provided in all areas of dental care including fillings, crowns and bridges, complete and partial dentures, implants, gum treatment, root canals, and extractions. Preventive care is also provided. Treatment is provided to both adults and children. °Patients are selected via a lottery and there is often a waiting list. °  °[facility] [address], ° ° (336) 763-8833  [email or web address].[email or web address] °  °[facility] [address], [place], [place] (336)723-1848, Ext. 123 Second and Fourth Thursday of each month, opens at 6:30 AM; Clinic ends at 9 AM.  Patients are seen on a first-come first-served basis, and a limited number are seen during each clinic.  ° °[facility] ° [address], [place], [place] (336) 723-7904   Eligibility Requirements °You must have lived in [place], [place], or [place] counties   for at least the last three months. °  You cannot be eligible for state or federal sponsored healthcare insurance, including Veterans Administration, Medicaid, or Medicare. °  You generally cannot be eligible for healthcare insurance through your employer.  °  How to apply: °Eligibility screenings are held every Tuesday and Wednesday afternoon from 1:00 pm until 4:00 pm. You do not need an appointment for the interview!  °[facility] [address], [place], [place] 336-631-2330   °[facility]  336-342-8273   °[facility]  336-703-3100   °[facility]  336-570-6415   ° °Behavioral Health Resources in the Community: °Intensive Outpatient Programs °Organization         Address  Phone  Notes  °[facility] [address], [place], [place] 336-878-6098   °[facility] [address], [place], [place] 336-832-9800   °[facility] [address], [place], [place] ° 336-882-2125   °[facility] [address],  °[place], [place] 1-800-853-5163 or 336-641-4981   °Substance Abuse Resources °Organization         Address  Phone  Notes  °[facility]  336-882-2125   °[facility]  336-784-9470   °[facility]  336-285-9073   °[facility]  336-845-3988   °Residential & Outpatient Substance Abuse Program  1-800-659-3381   °Psychological Services °Organization          Address  Phone  Notes  ° Health  336- 832-9600   °[facility]  336- 378-7881   °[facility] [address], [place] 1-800-853-5163 or 336-641-4981   ° °Mobile Crisis Teams °Organization         Address  Phone  Notes  °[facility], Mobile Crisis Care Unit  1-877-626-1772   °Assertive °[facility] ° [address] [address] [place], [place] 336-834-9664   °Sharon DeEsch [address] °[place] 336-554-5454   ° °Self-Help/Support Groups °Organization         Address  Phone             Notes  °[facility] - variety of support groups  336- 373-1402 Call for more information  °Narcotics Anonymous (NA), [facility] [address], °[place]  2 meetings at this location  ° °Residential Treatment Programs °Organization         Address  Phone  Notes  °[facility] [address],    °[place]  1-866-801-8205   °[facility] ° [address], [place], [place] 704-293-8524   °[facility] [address], [place] 336-845-3988 Admissions: 8am-3pm M-F  °[facility] [address]    °[place], [place] 336-841-1104   °[facility] [address], [place], [place] 336-379-7146   °[facility] [address]  °[place], [place] 336-285-9073   °[facility] - Intensive Outpatient [address], [place], [place] 336-852-3033   °[facility] [address]  °[place], [place] 1-877-615-2722 or 336-784-9470   °[facility] [address] [place], [place] 336-227-7417 Accepts Medicaid  °[facility] [address]  °  1-800-659-3381 Substance Abuse/Addiction Treatment  ° °[place] Behavioral Health Resources °Organization         Address  Phone  Notes  °[facility]  (888)   581-9988   °Julie Brannon, PhD [address] [place], [place]   (336) 349-5553 or (336) 951-0000   °[facility]   [address] °[place], [place] (336) 349-4454   °[facility] [address], [place], [place] (336) 342-8316 Insurance/Medicaid/sponsorship through [facility]  °[facility] [address]                                    [place], [place] (336) 342-8316 Therapy/tele-psych/case  °[facility] [address]  ° [place], [place] (336) 349-2233    °Dr. Arfeen  (336) 349-4544   °[facility] [place]  [employer] [place] [facility] 1) [address],  °2) [address], [place] °3)  [address], [place] (336) 349-3220 °(336) 342-7768 ° °(336) 342-8140   °[place] [facility] (336) 342-1394 or (336) 342-3537 (After Hours)    ° ° ° °

## 2014-11-12 ENCOUNTER — Emergency Department (HOSPITAL_COMMUNITY)
Admission: EM | Admit: 2014-11-12 | Discharge: 2014-11-12 | Disposition: A | Discharge status: Home / Self Care | Payer: Medicaid Other | Attending: Emergency Medicine | Admitting: Emergency Medicine

## 2014-11-12 ENCOUNTER — Encounter (HOSPITAL_COMMUNITY): Payer: Self-pay | Admitting: Emergency Medicine

## 2014-11-12 DIAGNOSIS — Z720 Tobacco use: Secondary | ICD-10-CM | POA: Insufficient documentation

## 2014-11-12 DIAGNOSIS — Z792 Long term (current) use of antibiotics: Secondary | ICD-10-CM | POA: Insufficient documentation

## 2014-11-12 DIAGNOSIS — I10 Essential (primary) hypertension: Secondary | ICD-10-CM | POA: Insufficient documentation

## 2014-11-12 DIAGNOSIS — K047 Periapical abscess without sinus: Secondary | ICD-10-CM | POA: Insufficient documentation

## 2014-11-12 DIAGNOSIS — K0889 Other specified disorders of teeth and supporting structures: Secondary | ICD-10-CM

## 2014-11-12 DIAGNOSIS — K029 Dental caries, unspecified: Secondary | ICD-10-CM

## 2014-11-12 MED ORDER — NAPROXEN 500 MG PO TABS: 500.0000 mg | ORAL_TABLET | Freq: Two times a day (BID) | ORAL | Status: DC

## 2014-11-12 MED ORDER — AMOXICILLIN 500 MG PO CAPS
500.0000 mg | ORAL_CAPSULE | Freq: Three times a day (TID) | ORAL | Status: DC
Start: 2014-11-12 — End: 2015-04-22

## 2014-11-12 NOTE — Discharge Instructions (Signed)
Take antibiotic as directed for pain. It is important to complete the entire course of antibiotics. Follow-up with the dentist for ultimate management of your dental pain.  Dental Pain A tooth ache may be caused by cavities (tooth decay). Cavities expose the nerve of the tooth to air and hot or cold temperatures. It may come from an infection or abscess (also called a boil or furuncle) around your tooth. It is also often caused by dental caries (tooth decay). This causes the pain you are having. DIAGNOSIS  Your caregiver can diagnose this problem by exam. TREATMENT   If caused by an infection, it may be treated with medications which kill germs (antibiotics) and pain medications as prescribed by your caregiver. Take medications as directed.  Only take over-the-counter or prescription medicines for pain, discomfort, or fever as directed by your caregiver.  Whether the tooth ache today is caused by infection or dental disease, you should see your dentist as soon as possible for further care. SEEK MEDICAL CARE IF: The exam and treatment you received today has been provided on an emergency basis only. This is not a substitute for complete medical or dental care. If your problem worsens or new problems (symptoms) appear, and you are unable to meet with your dentist, call or return to this location. SEEK IMMEDIATE MEDICAL CARE IF:   You have a fever.  You develop redness and swelling of your face, jaw, or neck.  You are unable to open your mouth.  You have severe pain uncontrolled by pain medicine. MAKE SURE YOU:   Understand these instructions.  Will watch your condition.  Will get help right away if you are not doing well or get worse. Document Released: 02/03/2005 Document Revised: 04/28/2011 Document Reviewed: 09/22/2007 [facility] Patient Information 2015 [place], [place]. This information is not intended to replace advice given to you by your health care provider. Make sure you discuss  any questions you have with your health care provider.  Periodontal Disease Periodontal disease, or gum disease, is a type of oral disease that affects the surrounding and supporting tissues of the teeth. These include the gums (gingivae), ligaments, and tooth socket (alveolar bone). Periodontal disease can affect one tooth or many teeth. If left untreated, it may lead to tooth loss.  CAUSES The main cause of periodontal disease is dental plaque, which contains harmful bacteria. These bacteria can cause the gums to become inflamed and infected. Further progression of the disease can damage the other supporting tissues.  RISK FACTORS  Diabetes.   Smoking and tobacco use.   Genetics.   Hormonal changes of puberty, menopause, and pregnancy.   Stress.   Clenching or grinding your teeth.   Substance abuse.  Poor nutrition.   Diseases that interfere with the body's immune system.   Certain medicines. SIGNS AND SYMPTOMS  Red or swollen gums.  Bad breath that does not go away.  Gums that have pulled away from the teeth.  Gums that bleed easily.  Permanent teeth that are loose or separating.  Pain when chewing.  Changes in the way your teeth fit together.  Sensitive teeth. DIAGNOSIS  A thorough examination of the periodontal tissues will be done by your dentist. X-rays may be needed. Evaluation of your medical history will be needed to see if there are other factors or underlying conditions that may contribute to the disease. TREATMENT The number and types of treatment will vary depending on the extent of the disease. Treatment may include brushing and flossing  only. Further disease progression may necessitate scaling and root planing or even surgery. The main goal is to control the infection. Good oral hygiene at home is necessary for the success of all types of treatment. HOME CARE INSTRUCTIONS   Practice good oral hygiene. This includes flossing and brushing your  teeth every day.   See your dentist regularly, at least 2 times per year.   Stop smoking if you smoke.  Eat a well-balanced diet. SEEK IMMEDIATE DENTAL CARE IF:   You have any signs or symptoms of periodontal disease along with:  Swelling of your face, neck, or jaw.  Inability to open your mouth.  Severe pain uncontrolled by pain medicine.  You have a fever or persistent symptoms for more than 2-3 days.  You have a fever and your symptoms suddenly get worse. Document Released: 02/06/2003 Document Revised: 10/06/2012 Document Reviewed: 07/13/2012 [facility] Patient Information 2015 [place], [place]. This information is not intended to replace advice given to you by your health care provider. Make sure you discuss any questions you have with your health care provider.

## 2014-11-12 NOTE — ED Notes (Signed)
Pt from home c/o dental pain that started yesterday. Pt sts that he went to dentist before but could not afford to have work done. Pt has not other c/o. Pt is A&O and in NAD

## 2014-11-12 NOTE — ED Provider Notes (Signed)
CSN: [id number]     Arrival date & time 11/12/14  1421 History  This chart was scribed for non-physician practitioner Celene Skeen, PA-C working with Lorre Nick, MD by Lyndel Safe, ED Scribe. This patient was seen in room WTR6/WTR6 and the patient's care was started at 2:43 PM.   No chief complaint on file.   The history is provided by the patient. No language interpreter was used.   HPI Comments: Lee Garcia is a 29 y.o. male, with a PMhx of HTN, who presents to the Emergency Department complaining of sudden onset, constant, moderate left, lower dental pain with surrounding gingival swelling onset last night during sleep. The pt has a PMhx of a dental infections and states his current pain feels similar to his pain associated with previous dental infections. He has taken ibuprofen without relief. The pt has not attempted to make an appointment with a dentist. He was able to eat breakfast but reports he cannot chew on his left side secondary to pain. Denies facial swelling, fevers, or trouble swallowing.   Past Medical History  Diagnosis Date  . Hypertension    Past Surgical History  Procedure Laterality Date  . Back surgery     Family History  Problem Relation Age of Onset  . Diabetes Mother   . Hypertension Mother   . Diabetes Father   . Hypertension Father    Social History  Substance Use Topics  . Smoking status: Current Every Day Smoker -- 0.25 packs/day  . Smokeless tobacco: Never Used  . Alcohol Use: Yes    Review of Systems  Constitutional: Negative for fever.  HENT: Positive for dental problem ( left, lower). Negative for facial swelling and trouble swallowing.   All other systems reviewed and are negative.  Allergies  Review of patient's allergies indicates no known allergies.  Home Medications   Prior to Admission medications   Medication Sig Start Date End Date Taking? Authorizing Provider  amoxicillin (AMOXIL) 500 MG capsule Take 1 capsule (500 mg  total) by mouth 3 (three) times daily. 11/12/14   Robyn M Hess, PA-C  clindamycin (CLEOCIN) 300 MG capsule Take 1 capsule (300 mg total) by mouth 3 (three) times daily. 04/20/14   Ozella Rocks, MD  HYDROcodone-acetaminophen (NORCO/VICODIN) 5-325 MG per tablet Take 1-2 tablets by mouth every 4 (four) hours as needed for moderate pain. 04/21/14   Gilda Crease, MD  naproxen (NAPROSYN) 500 MG tablet Take 1 tablet (500 mg total) by mouth 2 (two) times daily. 11/12/14   Kathrynn Speed, PA-C  traMADol (ULTRAM) 50 MG tablet Take 1 tablet (50 mg total) by mouth every 6 (six) hours as needed. 04/21/14   Ozella Rocks, MD   BP 169/87 mmHg  Pulse 89  Temp(Src) 98.7 F (37.1 C) (Oral)  Resp 18  SpO2 98% Physical Exam  Constitutional: He is oriented to person, place, and time. He appears well-developed and well-nourished. No distress.  HENT:  Head: Normocephalic and atraumatic.  Mouth/Throat: Uvula is midline and oropharynx is clear and moist. Abnormal dentition. Dental caries present. No dental abscesses.    Eyes: Conjunctivae and EOM are normal.  Neck: Normal range of motion. Neck supple.  Cardiovascular: Normal rate, regular rhythm and normal heart sounds.   Pulmonary/Chest: Effort normal and breath sounds normal.  Musculoskeletal: Normal range of motion. He exhibits no edema.  Neurological: He is alert and oriented to person, place, and time.  Skin: Skin is warm and dry.  Psychiatric: He has  a normal mood and affect. His behavior is normal.  Nursing note and vitals reviewed.   ED Course  Procedures  DIAGNOSTIC STUDIES: Oxygen Saturation is 98% on RA, normal by my interpretation.    COORDINATION OF CARE: 2:48 PM Discussed treatment plan with pt at bedside which includes to prescribe antibiotics and give dental referral. Pt agreed to plan. Will    MDM   Final diagnoses:  Dental infection  Tooth decay  Pain, dental   Dental pain with associated infection. No evidence of dental  abscess or evidence of Ludwig angina. Treat with amoxicillin. Naproxen for pain. Follow-up with dentist. Resources given. Stable for discharge. Return precautions given. Patient states understanding of treatment care plan and is agreeable.  I personally performed the services described in this documentation, which was scribed in my presence. The recorded information has been reviewed and is accurate.  Kathrynn Speed, PA-C 11/12/14 1500  Lorre Nick, MD 11/14/14 1246

## 2015-03-27 ENCOUNTER — Encounter (HOSPITAL_COMMUNITY): Payer: Self-pay | Admitting: *Deleted

## 2015-03-27 ENCOUNTER — Emergency Department (INDEPENDENT_AMBULATORY_CARE_PROVIDER_SITE_OTHER)
Admission: EM | Admit: 2015-03-27 | Discharge: 2015-03-27 | Disposition: A | Discharge status: Home / Self Care | Payer: 59 | Source: Home / Self Care | Attending: Family Medicine | Admitting: Family Medicine

## 2015-03-27 DIAGNOSIS — K047 Periapical abscess without sinus: Secondary | ICD-10-CM | POA: Diagnosis not present

## 2015-03-27 MED ORDER — DICLOFENAC POTASSIUM 50 MG PO TABS: 50.0000 mg | ORAL_TABLET | Freq: Three times a day (TID) | ORAL | Status: DC

## 2015-03-27 MED ORDER — CLINDAMYCIN HCL 300 MG PO CAPS: 300.0000 mg | ORAL_CAPSULE | Freq: Three times a day (TID) | ORAL | Status: DC

## 2015-03-27 NOTE — ED Notes (Signed)
Pt  Has  A  r  Upper toothache    Since  Yesterday    Airway is  Intact         Symptoms  Not  releived  By OTC   meds

## 2015-03-27 NOTE — ED Provider Notes (Signed)
CSN: [id number]     Arrival date & time 03/27/15  1645 History   First MD Initiated Contact with Patient 03/27/15 1818     Chief Complaint  Patient presents with  . Dental Pain   (Consider location/radiation/quality/duration/timing/severity/associated sxs/prior Treatment) Patient is a 30 y.o. male presenting with tooth pain. The history is provided by the patient.  Dental Pain Location:  Lower Lower teeth location:  31/RL 2nd molar Quality:  Throbbing and shooting Severity:  Moderate Onset quality:  Gradual Duration:  1 month Timing:  Intermittent Progression:  Waxing and waning Chronicity:  Chronic Context: abscess, dental caries and poor dentition   Context comment:  Seen by dentist and referred to oral surg --but pt doesn't have $900 for care, comes here. Associated symptoms: gum swelling   Associated symptoms: no facial swelling and no fever   Risk factors: lack of dental care, periodontal disease and smoking     Past Medical History  Diagnosis Date  . Hypertension    Past Surgical History  Procedure Laterality Date  . Back surgery     Family History  Problem Relation Age of Onset  . Diabetes Mother   . Hypertension Mother   . Diabetes Father   . Hypertension Father    Social History  Substance Use Topics  . Smoking status: Current Every Day Smoker -- 0.25 packs/day  . Smokeless tobacco: Never Used  . Alcohol Use: Yes    Review of Systems  Constitutional: Negative.  Negative for fever.  HENT: Positive for dental problem. Negative for facial swelling.   All other systems reviewed and are negative.   Allergies  Review of patient's allergies indicates no known allergies.  Home Medications   Prior to Admission medications   Medication Sig Start Date End Date Taking? Authorizing Provider  amoxicillin (AMOXIL) 500 MG capsule Take 1 capsule (500 mg total) by mouth 3 (three) times daily. 11/12/14   Robyn M Hess, PA-C  clindamycin (CLEOCIN) 300 MG capsule Take 1  capsule (300 mg total) by mouth 3 (three) times daily. 03/27/15   Linna Hoff, MD  diclofenac (CATAFLAM) 50 MG tablet Take 1 tablet (50 mg total) by mouth 3 (three) times daily. 03/27/15   Linna Hoff, MD  HYDROcodone-acetaminophen (NORCO/VICODIN) 5-325 MG per tablet Take 1-2 tablets by mouth every 4 (four) hours as needed for moderate pain. 04/21/14   Gilda Crease, MD  naproxen (NAPROSYN) 500 MG tablet Take 1 tablet (500 mg total) by mouth 2 (two) times daily. 11/12/14   Kathrynn Speed, PA-C  traMADol (ULTRAM) 50 MG tablet Take 1 tablet (50 mg total) by mouth every 6 (six) hours as needed. 04/21/14   Ozella Rocks, MD   Meds Ordered and Administered this Visit  Medications - No data to display  BP 158/82 mmHg  Pulse 74  Temp(Src) 98.6 F (37 C) (Oral)  Resp 16  SpO2 98% No data found.   Physical Exam  Constitutional: He appears well-developed and well-nourished. No distress.  HENT:  Mouth/Throat: Uvula is midline, oropharynx is clear and moist and mucous membranes are normal. Abnormal dentition. Dental abscesses present. No uvula swelling.    Lymphadenopathy:    He has cervical adenopathy.  Nursing note and vitals reviewed.   ED Course  Procedures (including critical care time)  Labs Review Labs Reviewed - No data to display  Imaging Review No results found.   Visual Acuity Review  Right Eye Distance:   Left Eye Distance:  Bilateral Distance:    Right Eye Near:   Left Eye Near:    Bilateral Near:         MDM   1. Abscess, dental    Meds ordered this encounter  Medications  . clindamycin (CLEOCIN) 300 MG capsule    Sig: Take 1 capsule (300 mg total) by mouth 3 (three) times daily.    Dispense:  21 capsule    Refill:  0  . diclofenac (CATAFLAM) 50 MG tablet    Sig: Take 1 tablet (50 mg total) by mouth 3 (three) times daily.    Dispense:  30 tablet    Refill:  0       Linna Hoff, MD 03/27/15 [phone]

## 2015-03-27 NOTE — Discharge Instructions (Signed)
Take medicine as prescribed, see your dentist as soon as possible °

## 2015-04-22 ENCOUNTER — Emergency Department (HOSPITAL_COMMUNITY)
Admission: EM | Admit: 2015-04-22 | Discharge: 2015-04-22 | Disposition: A | Discharge status: Home / Self Care | Payer: 59 | Attending: Emergency Medicine | Admitting: Emergency Medicine

## 2015-04-22 ENCOUNTER — Encounter (HOSPITAL_COMMUNITY): Payer: Self-pay | Admitting: Adult Health

## 2015-04-22 DIAGNOSIS — I10 Essential (primary) hypertension: Secondary | ICD-10-CM | POA: Insufficient documentation

## 2015-04-22 DIAGNOSIS — F17200 Nicotine dependence, unspecified, uncomplicated: Secondary | ICD-10-CM | POA: Insufficient documentation

## 2015-04-22 DIAGNOSIS — K0889 Other specified disorders of teeth and supporting structures: Secondary | ICD-10-CM | POA: Diagnosis present

## 2015-04-22 DIAGNOSIS — Z791 Long term (current) use of non-steroidal anti-inflammatories (NSAID): Secondary | ICD-10-CM | POA: Insufficient documentation

## 2015-04-22 DIAGNOSIS — Z792 Long term (current) use of antibiotics: Secondary | ICD-10-CM | POA: Diagnosis not present

## 2015-04-22 DIAGNOSIS — K029 Dental caries, unspecified: Secondary | ICD-10-CM | POA: Diagnosis not present

## 2015-04-22 MED ORDER — AMOXICILLIN 500 MG PO CAPS: 500.0000 mg | ORAL_CAPSULE | Freq: Three times a day (TID) | ORAL | Status: DC

## 2015-04-22 MED ORDER — TRAMADOL HCL 50 MG PO TABS: 50.0000 mg | ORAL_TABLET | Freq: Four times a day (QID) | ORAL | Status: DC | PRN

## 2015-04-22 MED ORDER — AMOXICILLIN 500 MG PO CAPS
500.0000 mg | ORAL_CAPSULE | Freq: Once | ORAL | Status: AC
  Administered 2015-04-22: 500 mg via ORAL
  Filled 2015-04-22: qty 1

## 2015-04-22 MED ORDER — HYDROCODONE-ACETAMINOPHEN 5-325 MG PO TABS
1.0000 | ORAL_TABLET | Freq: Once | ORAL | Status: AC
Start: 2015-04-22 — End: 2015-04-22
  Administered 2015-04-22: 1 via ORAL
  Filled 2015-04-22: qty 1

## 2015-04-22 NOTE — ED Provider Notes (Signed)
CSN: [id number]     Arrival date & time 04/22/15  0100 History   First MD Initiated Contact with Patient 04/22/15 0116     Chief Complaint  Patient presents with  . Dental Pain     (Consider location/radiation/quality/duration/timing/severity/associated sxs/prior Treatment) Patient is a 30 y.o. male presenting with tooth pain. The history is provided by the patient.  Dental Pain Location:  Lower Lower teeth location:  32/RL 3rd molar Quality:  Throbbing and constant Severity:  Severe Onset quality:  Gradual Duration:  2 days Timing:  Constant Progression:  Worsening Chronicity:  New Relieved by:  Nothing Worsened by:  Cold food/drink, touching and jaw movement Ineffective treatments:  None tried Associated symptoms: no facial swelling, no neck swelling and no oral bleeding   Risk factors: smoking    Lee Garcia is a 30 y.o. male who presents to the ED with right lower 3rd molar pain. He reports that he is scheduled with an oral surgeon to have the tooth removed next week, however, tonight the tooth started hurting and the gum swelling around the tooth. Patient states he thinks it is infected.  Past Medical History  Diagnosis Date  . Hypertension    Past Surgical History  Procedure Laterality Date  . Back surgery     Family History  Problem Relation Age of Onset  . Diabetes Mother   . Hypertension Mother   . Diabetes Father   . Hypertension Father    Social History  Substance Use Topics  . Smoking status: Current Every Day Smoker -- 0.25 packs/day  . Smokeless tobacco: Never Used  . Alcohol Use: Yes    Review of Systems  HENT: Positive for dental problem. Negative for facial swelling.    All other systems negative   Allergies  Review of patient's allergies indicates no known allergies.  Home Medications   Prior to Admission medications   Medication Sig Start Date End Date Taking? Authorizing Provider  amoxicillin (AMOXIL) 500 MG capsule Take 1  capsule (500 mg total) by mouth 3 (three) times daily. 04/22/15   Gorgeous [place] Orlene Och, NP  clindamycin (CLEOCIN) 300 MG capsule Take 1 capsule (300 mg total) by mouth 3 (three) times daily. 03/27/15   Linna Hoff, MD  diclofenac (CATAFLAM) 50 MG tablet Take 1 tablet (50 mg total) by mouth 3 (three) times daily. 03/27/15   Linna Hoff, MD  HYDROcodone-acetaminophen (NORCO/VICODIN) 5-325 MG per tablet Take 1-2 tablets by mouth every 4 (four) hours as needed for moderate pain. 04/21/14   Gilda Crease, MD  naproxen (NAPROSYN) 500 MG tablet Take 1 tablet (500 mg total) by mouth 2 (two) times daily. 11/12/14   Kathrynn Speed, PA-C  traMADol (ULTRAM) 50 MG tablet Take 1 tablet (50 mg total) by mouth every 6 (six) hours as needed. 04/22/15   Mannie Wineland Orlene Och, NP   BP 162/90 mmHg  Pulse 65  Temp(Src) 98.2 F (36.8 C) (Oral)  Resp 18  Ht  (1.702 m)  Wt 105.745 kg  BMI 36.50 kg/m2  SpO2 100% Physical Exam  Constitutional: He is oriented to person, place, and time. He appears well-developed and well-nourished. No distress.  HENT:  Head: Normocephalic.  Right Ear: Tympanic membrane normal.  Left Ear: Tympanic membrane normal.  Nose: Nose normal.  Mouth/Throat: Uvula is midline, oropharynx is clear and moist and mucous membranes are normal.    Right lower third molar with decay. Tender on exam, swelling of the gum surrounding the tooth.  Eyes: Conjunctivae and EOM are normal.  Neck: Normal range of motion. Neck supple.  Cardiovascular: Normal rate and regular rhythm.   Pulmonary/Chest: Effort normal and breath sounds normal.  Musculoskeletal: Normal range of motion.  Lymphadenopathy:    He has cervical adenopathy.  Neurological: He is alert and oriented to person, place, and time. No cranial nerve deficit.  Skin: Skin is warm and dry.  Psychiatric: He has a normal mood and affect. His behavior is normal.  Nursing note and vitals reviewed.   ED Course  Procedures (including critical care  time) Labs Review  MDM  30 y.o. male with dental pain stable for d/c without trismus, fever or difficulty swallowing. He will f/u with the oral surgeon as scheduled. Rx for antibiotics and pain medication. He will return here as need.   Final diagnoses:  Pain due to dental caries      Janne NapoleonHope M Jerusalen Mateja, NP 04/22/15 [id number]  Benjiman CoreNathan Pickering, MD 04/22/15 [phone]

## 2015-04-22 NOTE — ED Notes (Signed)
Resents with tooth pain in right lower tooth began yesterday, has broken molar-scheduled to see oral surgeon next week. Pain is described as severe.

## 2015-04-22 NOTE — Discharge Instructions (Signed)
Dental Caries Dental caries is tooth decay. This decay can cause a hole in teeth (cavity) that can get bigger and deeper over time. HOME CARE  Brush and floss your teeth. Do this at least two times a day.  Use a fluoride toothpaste.  Use a mouth rinse if told by your dentist or doctor.  Eat less sugary and starchy foods. Drink less sugary drinks.  Avoid snacking often on sugary and starchy foods. Avoid sipping often on sugary drinks.  Keep regular checkups and cleanings with your dentist.  Use fluoride supplements if told by your dentist or doctor.  Allow fluoride to be applied to teeth if told by your dentist or doctor.   This information is not intended to replace advice given to you by your health care provider. Make sure you discuss any questions you have with your health care provider.   Document Released: 11/13/2007 Document Revised: 02/24/2014 Document Reviewed: 02/06/2012 Elsevier Interactive Patient Education 2016 [employer]  Dental Pain Dental pain may be caused by many things, including:  Tooth decay (cavities or caries). Cavities expose the nerve of your tooth to air and hot or cold temperatures. This can cause pain or discomfort.  Abscess or infection. A dental abscess is a collection of infected pus from a bacterial infection in the inner part of the tooth (pulp). It usually occurs at the end of the tooth's root.  Injury.  An unknown reason (idiopathic). Your pain may be mild or severe. It may only occur when:  You are chewing.  You are exposed to hot or cold temperature.  You are eating or drinking sugary foods or beverages, such as soda or candy. Your pain may also be constant. HOME CARE INSTRUCTIONS Watch your dental pain for any changes. The following actions may help to lessen any discomfort that you are feeling:  Take medicines only as directed by your dentist.  If you were prescribed an antibiotic medicine, finish all of it even if you start to  feel better.  Keep all follow-up visits as directed by your dentist. This is important.  Do not apply heat to the outside of your face.  Rinse your mouth or gargle with salt water if directed by your dentist. This helps with pain and swelling.  You can make salt water by adding  tsp of salt to 1 cup of warm water.  Apply ice to the painful area of your face:  Put ice in a plastic bag.  Place a towel between your skin and the bag.  Leave the ice on for 20 minutes, 2-3 times per day.  Avoid foods or drinks that cause you pain, such as:  Very hot or very cold foods or drinks.  Sweet or sugary foods or drinks. SEEK MEDICAL CARE IF:  Your pain is not controlled with medicines.  Your symptoms are worse.  You have new symptoms. SEEK IMMEDIATE MEDICAL CARE IF:  You are unable to open your mouth.  You are having trouble breathing or swallowing.  You have a fever.  Your face, neck, or jaw is swollen.   This information is not intended to replace advice given to you by your health care provider. Make sure you discuss any questions you have with your health care provider.   Document Released: 02/03/2005 Document Revised: 06/20/2014 Document Reviewed: 01/30/2014 Elsevier Interactive Patient Education [employer]2016 [employer]

## 2015-06-24 ENCOUNTER — Encounter (HOSPITAL_COMMUNITY): Payer: Self-pay

## 2015-06-24 ENCOUNTER — Emergency Department (HOSPITAL_COMMUNITY)
Admission: EM | Admit: 2015-06-24 | Discharge: 2015-06-24 | Disposition: A | Discharge status: Home / Self Care | Payer: 59 | Attending: Emergency Medicine | Admitting: Emergency Medicine

## 2015-06-24 DIAGNOSIS — Y9389 Activity, other specified: Secondary | ICD-10-CM | POA: Diagnosis not present

## 2015-06-24 DIAGNOSIS — S199XXA Unspecified injury of neck, initial encounter: Secondary | ICD-10-CM | POA: Diagnosis present

## 2015-06-24 DIAGNOSIS — Y92410 Unspecified street and highway as the place of occurrence of the external cause: Secondary | ICD-10-CM | POA: Insufficient documentation

## 2015-06-24 DIAGNOSIS — Z791 Long term (current) use of non-steroidal anti-inflammatories (NSAID): Secondary | ICD-10-CM | POA: Diagnosis not present

## 2015-06-24 DIAGNOSIS — F17200 Nicotine dependence, unspecified, uncomplicated: Secondary | ICD-10-CM | POA: Diagnosis not present

## 2015-06-24 DIAGNOSIS — S0990XA Unspecified injury of head, initial encounter: Secondary | ICD-10-CM | POA: Insufficient documentation

## 2015-06-24 DIAGNOSIS — S3992XA Unspecified injury of lower back, initial encounter: Secondary | ICD-10-CM | POA: Diagnosis not present

## 2015-06-24 DIAGNOSIS — Z043 Encounter for examination and observation following other accident: Secondary | ICD-10-CM

## 2015-06-24 DIAGNOSIS — Y998 Other external cause status: Secondary | ICD-10-CM | POA: Insufficient documentation

## 2015-06-24 DIAGNOSIS — I10 Essential (primary) hypertension: Secondary | ICD-10-CM | POA: Insufficient documentation

## 2015-06-24 DIAGNOSIS — Z041 Encounter for examination and observation following transport accident: Secondary | ICD-10-CM

## 2015-06-24 DIAGNOSIS — Z792 Long term (current) use of antibiotics: Secondary | ICD-10-CM | POA: Diagnosis not present

## 2015-06-24 MED ORDER — NAPROXEN 500 MG PO TABS: 500.0000 mg | ORAL_TABLET | Freq: Two times a day (BID) | ORAL | Status: DC

## 2015-06-24 MED ORDER — METHOCARBAMOL 500 MG PO TABS
1000.0000 mg | ORAL_TABLET | Freq: Once | ORAL | Status: AC
  Administered 2015-06-24: 1000 mg via ORAL
  Filled 2015-06-24: qty 2

## 2015-06-24 MED ORDER — LIDOCAINE 5 % EX PTCH: 1.0000 | MEDICATED_PATCH | CUTANEOUS | Status: DC

## 2015-06-24 MED ORDER — OXYCODONE-ACETAMINOPHEN 5-325 MG PO TABS
2.0000 | ORAL_TABLET | Freq: Once | ORAL | Status: AC
  Administered 2015-06-24: 2 via ORAL
  Filled 2015-06-24: qty 2

## 2015-06-24 MED ORDER — METHOCARBAMOL 500 MG PO TABS: 500.0000 mg | ORAL_TABLET | Freq: Two times a day (BID) | ORAL | Status: DC

## 2015-06-24 MED ORDER — TRAMADOL HCL 50 MG PO TABS: 50.0000 mg | ORAL_TABLET | Freq: Four times a day (QID) | ORAL | Status: DC | PRN

## 2015-06-24 MED ORDER — KETOROLAC TROMETHAMINE 60 MG/2ML IM SOLN
60.0000 mg | Freq: Once | INTRAMUSCULAR | Status: AC
  Administered 2015-06-24: 60 mg via INTRAMUSCULAR
  Filled 2015-06-24: qty 2
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
## 2015-06-26 ENCOUNTER — Encounter (HOSPITAL_COMMUNITY): Payer: Self-pay

## 2015-06-26 ENCOUNTER — Emergency Department (HOSPITAL_COMMUNITY)
Admission: EM | Admit: 2015-06-26 | Discharge: 2015-06-26 | Disposition: A | Discharge status: Home / Self Care | Payer: 59 | Attending: Emergency Medicine | Admitting: Emergency Medicine

## 2015-06-26 DIAGNOSIS — Z79899 Other long term (current) drug therapy: Secondary | ICD-10-CM | POA: Insufficient documentation

## 2015-06-26 DIAGNOSIS — I10 Essential (primary) hypertension: Secondary | ICD-10-CM | POA: Insufficient documentation

## 2015-06-26 DIAGNOSIS — Z791 Long term (current) use of non-steroidal anti-inflammatories (NSAID): Secondary | ICD-10-CM | POA: Insufficient documentation

## 2015-06-26 DIAGNOSIS — H43399 Other vitreous opacities, unspecified eye: Secondary | ICD-10-CM | POA: Diagnosis not present

## 2015-06-26 DIAGNOSIS — F17200 Nicotine dependence, unspecified, uncomplicated: Secondary | ICD-10-CM | POA: Diagnosis not present

## 2015-06-26 DIAGNOSIS — R51 Headache: Secondary | ICD-10-CM | POA: Diagnosis present

## 2015-06-26 DIAGNOSIS — S0990XD Unspecified injury of head, subsequent encounter: Secondary | ICD-10-CM | POA: Insufficient documentation

## 2015-06-26 DIAGNOSIS — R519 Headache, unspecified: Secondary | ICD-10-CM

## 2015-06-26 MED ORDER — KETOROLAC TROMETHAMINE 15 MG/ML IJ SOLN: 15.0000 mg | Freq: Once | INTRAMUSCULAR | Status: DC

## 2015-06-26 MED ORDER — IBUPROFEN 800 MG PO TABS
800.0000 mg | ORAL_TABLET | Freq: Once | ORAL | Status: AC
  Administered 2015-06-26: 800 mg via ORAL
  Filled 2015-06-26: qty 1

## 2015-06-26 MED ORDER — DIPHENHYDRAMINE HCL 25 MG PO CAPS
25.0000 mg | ORAL_CAPSULE | Freq: Once | ORAL | Status: AC
  Administered 2015-06-26: 25 mg via ORAL
  Filled 2015-06-26: qty 1

## 2015-06-26 MED ORDER — KETOROLAC TROMETHAMINE 15 MG/ML IJ SOLN
15.0000 mg | Freq: Once | INTRAMUSCULAR | Status: DC
  Filled 2015-06-26: qty 1

## 2015-06-26 MED ORDER — METOCLOPRAMIDE HCL 10 MG PO TABS
10.0000 mg | ORAL_TABLET | Freq: Once | ORAL | Status: AC
  Administered 2015-06-26: 10 mg via ORAL
  Filled 2015-06-26: qty 1
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
## 2016-03-19 ENCOUNTER — Emergency Department (HOSPITAL_COMMUNITY)
Admission: EM | Admit: 2016-03-19 | Discharge: 2016-03-19 | Disposition: A | Discharge status: Home / Self Care | Payer: 59 | Attending: Emergency Medicine | Admitting: Emergency Medicine

## 2016-03-19 ENCOUNTER — Encounter (HOSPITAL_COMMUNITY): Payer: Self-pay

## 2016-03-19 DIAGNOSIS — I10 Essential (primary) hypertension: Secondary | ICD-10-CM | POA: Insufficient documentation

## 2016-03-19 DIAGNOSIS — K047 Periapical abscess without sinus: Secondary | ICD-10-CM | POA: Insufficient documentation

## 2016-03-19 DIAGNOSIS — F17200 Nicotine dependence, unspecified, uncomplicated: Secondary | ICD-10-CM | POA: Insufficient documentation

## 2016-03-19 MED ORDER — AMOXICILLIN 500 MG PO CAPS: 500.0000 mg | ORAL_CAPSULE | Freq: Three times a day (TID) | ORAL | 0 refills | Status: DC

## 2016-03-19 MED ORDER — NAPROXEN 500 MG PO TABS: 500.0000 mg | ORAL_TABLET | Freq: Two times a day (BID) | ORAL | 0 refills | Status: DC

## 2016-03-19 NOTE — ED Triage Notes (Signed)
Pt complaining of lower left dental pain. Pt states pain radiates to L ear. Pt states some broken teeth to back lower left.

## 2016-03-19 NOTE — ED Provider Notes (Signed)
[facility]-EMERGENCY DEPT Provider Note   CSN: [id number] Arrival date & time: 03/19/16  1810  By signing my name below, I, Lee Garcia, attest that this documentation has been prepared under the direction and in the presence of non-physician practitioner, Kerrie BuffaloHope Neese, NP. Electronically Signed: Nelwyn SalisburyJoshua Garcia, Scribe. 03/19/2016. 7:22 PM.   History   Chief Complaint Chief Complaint  Patient presents with  . Dental Pain   The history is provided by the patient. No language interpreter was used.  Dental Pain   This is a new problem. The current episode started more than 2 days ago. The problem occurs constantly. The problem has not changed since onset.The pain is at a severity of 8/10. The pain is mild. He has tried nothing for the symptoms. The treatment provided no relief.    HPI Comments:  Lee Garcia is a 31 y.o. male with pmhx of HTN who presents to the Emergency Department complaining of constant, worsening, mild lower left dental pain. He describes the pain as sharp and radiating into his left ear. His pain is worsened when eating and palpation. Pt denies any fever, chills, nausea or vomiting. He is an everyday smoker.   Past Medical History:  Diagnosis Date  . Hypertension     Patient Active Problem List   Diagnosis Date Noted  . Left-sided low back pain without sciatica 10/07/2013  . Essential hypertension 10/07/2013  . ANXIETY 09/21/2008  . PALPITATIONS 09/21/2008    Past Surgical History:  Procedure Laterality Date  . BACK SURGERY         Home Medications    Prior to Admission medications   Medication Sig Start Date End Date Taking? Authorizing Provider  amoxicillin (AMOXIL) 500 MG capsule Take 1 capsule (500 mg total) by mouth 3 (three) times daily. 03/19/16   Hope Orlene OchM Neese, NP  lidocaine (LIDODERM) 5 % Place 1 patch onto the skin daily. Remove & Discard patch within 12 hours or as directed by MD 06/24/15   Anselm PancoastShawn C Joy, PA-C  methocarbamol (ROBAXIN) 500 MG  tablet Take 1 tablet (500 mg total) by mouth 2 (two) times daily. 06/24/15   Shawn C Joy, PA-C  naproxen (NAPROSYN) 500 MG tablet Take 1 tablet (500 mg total) by mouth 2 (two) times daily. 03/19/16   Hope Orlene OchM Neese, NP    Family History Family History  Problem Relation Age of Onset  . Diabetes Mother   . Hypertension Mother   . Diabetes Father   . Hypertension Father     Social History Social History  Substance Use Topics  . Smoking status: Current Every Day Smoker    Packs/day: 0.25  . Smokeless tobacco: Never Used  . Alcohol use Yes     Allergies   Shellfish allergy   Review of Systems Review of Systems  Constitutional: Negative for chills and fever.  HENT: Positive for dental problem and ear pain. Negative for sore throat.   Eyes: Negative for pain.  Respiratory: Negative for cough.   Gastrointestinal: Negative for nausea and vomiting.  Musculoskeletal: Negative for neck pain.  Skin: Negative for wound.  Neurological: Negative for syncope and headaches.  Psychiatric/Behavioral: Negative for confusion.     Physical Exam Updated Vital Signs BP 147/92 (BP Location: Left Arm)   Pulse 81   Temp 99.1 F (37.3 C) (Oral)   Resp 18   SpO2 98%   Physical Exam  Constitutional: He is oriented to person, place, and time. He appears well-developed and well-nourished. No distress.  HENT:  Head: Normocephalic and atraumatic.  Right Ear: Tympanic membrane and ear canal normal.  Left Ear: Tympanic membrane and ear canal normal.  Nose: Nose normal.  Mouth/Throat: Uvula is midline, oropharynx is clear and moist and mucous membranes are normal. No posterior oropharyngeal edema or posterior oropharyngeal erythema.  Third left lower molar decayed with erythema surrounding the tooth.  Eyes: Conjunctivae and EOM are normal. Pupils are equal, round, and reactive to light.  Neck: Neck supple.  Cardiovascular: Normal rate and regular rhythm.   Pulmonary/Chest: Effort normal and breath  sounds normal.  Musculoskeletal: Normal range of motion.  Lymphadenopathy:    He has cervical adenopathy.  Neurological: He is alert and oriented to person, place, and time.  Skin: Skin is warm and dry.  Psychiatric: He has a normal mood and affect. His behavior is normal.  Nursing note and vitals reviewed.    ED Treatments / Results  DIAGNOSTIC STUDIES:  Oxygen Saturation is 98% on RA, normal by my interpretation.    COORDINATION OF CARE:  7:49 PM Discussed treatment plan with pt at bedside and pt agreed to plan.  Labs (all labs ordered are listed, but only abnormal results are displayed) Labs Reviewed - No data to display  Radiology No results found.  Procedures Procedures (including critical care time)  Medications Ordered in ED Medications - No data to display   Initial Impression / Assessment and Plan / ED Course  I have reviewed the triage vital signs and the nursing notes.    Final Clinical Impressions(s) / ED Diagnoses  31 y.o. male with dental pain due to caries and infection. Stable for d/c without fever and does not appear toxic. No concern for Ludwig's angina, tonsillar abscess or extending infection. Will treat with antibiotics and NSAIDS and patient to f/u with a dentist as soon as possible.  Also discussed with patient since he has a hx of HTN he need a PCP to follow his BP.  Final diagnoses:  Dental infection    New Prescriptions New Prescriptions   AMOXICILLIN (AMOXIL) 500 MG CAPSULE    Take 1 capsule (500 mg total) by mouth 3 (three) times daily.   NAPROXEN (NAPROSYN) 500 MG TABLET    Take 1 tablet (500 mg total) by mouth 2 (two) times daily.   I personally performed the services described in this documentation, which was scribed in my presence. The recorded information has been reviewed and is accurate.     [place], NP 03/19/16 1957    Charlynne Pander, MD 03/23/16 [phone]

## 2016-03-19 NOTE — Discharge Instructions (Signed)
Follow up with your dentist as scheduled. Return here as needed.  Be sure to get a primary care doctor to follow your blood pressure.

## 2017-12-08 ENCOUNTER — Emergency Department (HOSPITAL_COMMUNITY)
Admission: EM | Admit: 2017-12-08 | Discharge: 2017-12-08 | Disposition: A | Discharge status: Home / Self Care | Payer: 59 | Attending: Emergency Medicine | Admitting: Emergency Medicine

## 2017-12-08 ENCOUNTER — Encounter (HOSPITAL_COMMUNITY): Payer: Self-pay | Admitting: Emergency Medicine

## 2017-12-08 ENCOUNTER — Other Ambulatory Visit: Payer: Self-pay

## 2017-12-08 DIAGNOSIS — F17210 Nicotine dependence, cigarettes, uncomplicated: Secondary | ICD-10-CM | POA: Insufficient documentation

## 2017-12-08 DIAGNOSIS — K0889 Other specified disorders of teeth and supporting structures: Secondary | ICD-10-CM | POA: Insufficient documentation

## 2017-12-08 DIAGNOSIS — I10 Essential (primary) hypertension: Secondary | ICD-10-CM | POA: Insufficient documentation

## 2017-12-08 MED ORDER — AMOXICILLIN 500 MG PO CAPS
500.0000 mg | ORAL_CAPSULE | Freq: Once | ORAL | Status: AC
  Administered 2017-12-08: 500 mg via ORAL
  Filled 2017-12-08: qty 1

## 2017-12-08 MED ORDER — AMOXICILLIN 500 MG PO CAPS: 500.0000 mg | ORAL_CAPSULE | Freq: Three times a day (TID) | ORAL | 0 refills | Status: DC

## 2017-12-08 MED ORDER — HYDROCODONE-ACETAMINOPHEN 5-325 MG PO TABS: 1.0000 | ORAL_TABLET | Freq: Three times a day (TID) | ORAL | 0 refills | Status: DC | PRN

## 2017-12-08 NOTE — ED Triage Notes (Signed)
Pt reports having dental pain with left lower pain to jaw. Pt states pain has been ongoing for 3 days.

## 2017-12-08 NOTE — Discharge Instructions (Signed)
Take amoxicillin for treatment of infection.  Continue naproxen every 12 hours for management of pain.  You have been prescribed Norco to take as needed for severe pain.  Do not drive or drink alcohol after taking this medication as it may make you drowsy and impair your judgment.  We recommend follow-up with a dentist or oral surgeon to ensure resolution of symptoms.  Return to the ED for any new or concerning symptoms.

## 2017-12-08 NOTE — ED Provider Notes (Signed)
[facility]-EMERGENCY DEPT Provider Note   CSN: [id number] Arrival date & time: 12/08/17  0307     History   Chief Complaint Chief Complaint  Patient presents with  . Dental Pain    HPI Lee Garcia is a 32 y.o. male.  32 year old male presents to the emergency department for evaluation of dentalgia x3 days.  States the pain has been to his left lower wisdom tooth.  Reports prior issue with this tooth, but has been unable to have it removed due to lack of insurance.  He has been homeless until recently.  States that he gets insurance with his job in the next few months.  He has been taking naproxen for pain management without relief.  Denies any fevers, inability to open his jaw, difficulty swallowing.     Past Medical History:  Diagnosis Date  . Hypertension     Patient Active Problem List   Diagnosis Date Noted  . Left-sided low back pain without sciatica 10/07/2013  . Essential hypertension 10/07/2013  . ANXIETY 09/21/2008  . PALPITATIONS 09/21/2008    Past Surgical History:  Procedure Laterality Date  . BACK SURGERY          Home Medications    Prior to Admission medications   Medication Sig Start Date End Date Taking? Authorizing Provider  amoxicillin (AMOXIL) 500 MG capsule Take 1 capsule (500 mg total) by mouth 3 (three) times daily. 12/08/17   Antony Madura, PA-C  HYDROcodone-acetaminophen (NORCO/VICODIN) 5-325 MG tablet Take 1-2 tablets by mouth every 8 (eight) hours as needed for severe pain. 12/08/17   Antony Madura, PA-C  lidocaine (LIDODERM) 5 % Place 1 patch onto the skin daily. Remove & Discard patch within 12 hours or as directed by MD 06/24/15   Joy, Shawn C, PA-C  methocarbamol (ROBAXIN) 500 MG tablet Take 1 tablet (500 mg total) by mouth 2 (two) times daily. 06/24/15   Joy, Shawn C, PA-C  naproxen (NAPROSYN) 500 MG tablet Take 1 tablet (500 mg total) by mouth 2 (two) times daily. 03/19/16   Janne Napoleon, NP  lisinopril  (PRINIVIL,ZESTRIL) 5 MG tablet Take 1 tablet (5 mg total) by mouth daily. 08/11/13 10/07/13  Rodolph Bong, MD    Family History Family History  Problem Relation Age of Onset  . Diabetes Mother   . Hypertension Mother   . Diabetes Father   . Hypertension Father     Social History Social History   Tobacco Use  . Smoking status: Current Every Day Smoker    Packs/day: 0.25  . Smokeless tobacco: Never Used  Substance Use Topics  . Alcohol use: Yes  . Drug use: No     Allergies   Shellfish allergy   Review of Systems Review of Systems Ten systems reviewed and are negative for acute change, except as noted in the HPI.    Physical Exam Updated Vital Signs BP (!) 173/107 (BP Location: Right Arm)   Pulse 81   Temp 98.2 F (36.8 C) (Oral)   Resp 18   Ht 5\' 7"  (1.702 m)   Wt 105.7 kg   SpO2 100%   BMI 36.49 kg/m   Physical Exam  Constitutional: He is oriented to person, place, and time. He appears well-developed and well-nourished. No distress.  Nontoxic appearing and in no distress  HENT:  Head: Normocephalic and atraumatic.  Dental fracture and decay to the left lower wisdom tooth.  Mild associated gingival swelling.  No significant facial swelling.  No trismus.  Soft oral floor.  Normal phonation.  Eyes: Conjunctivae and EOM are normal. No scleral icterus.  Neck: Normal range of motion.  No meningismus.  Pulmonary/Chest: Effort normal. No respiratory distress.  Respirations even and unlabored.  Musculoskeletal: Normal range of motion.  Neurological: He is alert and oriented to person, place, and time. He exhibits normal muscle tone. Coordination normal.  Skin: Skin is warm and dry. No rash noted. He is not diaphoretic. No erythema. No pallor.  Psychiatric: He has a normal mood and affect. His behavior is normal.  Nursing note and vitals reviewed.    ED Treatments / Results  Labs (all labs ordered are listed, but only abnormal results are displayed) Labs  Reviewed - No data to display  EKG None  Radiology No results found.  Procedures Procedures (including critical care time)  Medications Ordered in ED Medications  amoxicillin (AMOXIL) capsule 500 mg (500 mg Oral Given 12/08/17 0513)     Initial Impression / Assessment and Plan / ED Course  I have reviewed the triage vital signs and the nursing notes.  Pertinent labs & imaging results that were available during my care of the patient were reviewed by me and considered in my medical decision making (see chart for details).     Patient with toothache.  No gross abscess.  Exam unconcerning for Ludwig's angina or spread of infection.  Will treat with amoxicillin and pain medicine.  Urged patient to follow-up with dentist.  Return precautions discussed and provided. Patient discharged in stable condition with no unaddressed concerns.   Final Clinical Impressions(s) / ED Diagnoses   Final diagnoses:  Dentalgia    ED Discharge Orders         Ordered    amoxicillin (AMOXIL) 500 MG capsule  3 times daily     12/08/17 0507    HYDROcodone-acetaminophen (NORCO/VICODIN) 5-325 MG tablet  Every 8 hours PRN     12/08/17 0507           Antony Madura, PA-C 12/08/17 9604    Derwood Kaplan, MD 12/08/17 [phone]

## 2018-10-16 ENCOUNTER — Ambulatory Visit (HOSPITAL_COMMUNITY)
Admission: EM | Admit: 2018-10-16 | Discharge: 2018-10-16 | Disposition: A | Discharge status: Home / Self Care | Payer: Self-pay | Attending: Family Medicine | Admitting: Family Medicine

## 2018-10-16 ENCOUNTER — Encounter (HOSPITAL_COMMUNITY): Payer: Self-pay

## 2018-10-16 ENCOUNTER — Other Ambulatory Visit: Payer: Self-pay

## 2018-10-16 DIAGNOSIS — I10 Essential (primary) hypertension: Secondary | ICD-10-CM

## 2018-10-16 DIAGNOSIS — S99921A Unspecified injury of right foot, initial encounter: Secondary | ICD-10-CM

## 2018-10-16 NOTE — Discharge Instructions (Signed)
You may use over the counter ibuprofen or acetaminophen as needed.  ° °

## 2018-10-16 NOTE — ED Triage Notes (Signed)
Pt present right foot/ toe injury. Pt states he dropped his phone on his right foot/ fourth toe next to his pinky.  Pt states he can move all of his toes except the fourth toe.

## 2018-10-18 NOTE — ED Provider Notes (Signed)
[facility]   [id number] 10/16/18 Arrival Time: [place]:  1. Toe injury, right, initial encounter   2. Essential hypertension     Declines imaging of toe today.  To begin wearing: Orders Placed This Encounter  Procedures  . Post op boot   Prefers OTC analgesics.  May f/u here at any time to recheck BP. Encouraged him to est care with PCP to monitor BP.  Follow-up Information    [facility].   Specialty: Urgent Care Why: If worsening or failing to improve as anticipated. Contact information: [place] [phone]         Reviewed expectations re: course of current medical issues. Questions answered. Outlined signs and symptoms indicating need for more acute intervention. Patient verbalized understanding. After Visit Summary given.  SUBJECTIVE: History from: patient. Lee Garcia is a 33 y.o. male who reports persistent mild to moderate pain of his right fourth toe; described as aching without radiation. Onset: abrupt, today. Injury/trama: reports dropping his phone on bare fourth toe; immediate pain. Symptoms have progressed to a point and plateaued since beginning. Aggravating factors: weight bearing and trying to bend toes Alleviating factors: none identified. Associated symptoms: none reported. Extremity sensation changes or weakness: none. Self treatment: has not tried OTCs for relief of pain. History of similar: no.  Past Surgical History:  Procedure Laterality Date  . BACK SURGERY       ROS: As per HPI.   OBJECTIVE:  Vitals:   10/16/18 1543  BP: (!) 157/92  Pulse: 87  Resp: 16  Temp: 98.4 F (36.9 C)  TempSrc: Oral  SpO2: 99%    General appearance: alert; no distress HEENT: [place]; AT Neck: supple with FROM Resp: unlabored respirations Extremities: . RLE: warm and well perfused; fairly well localized moderate tenderness over right  fourth toe; without gross deformities; with mild swelling; with slight bruising; ROM: limited by pain CV: brisk extremity capillary refill of RLE; 2+ DP/PT pulse of RLE. Skin: warm and dry; no visible rashes Neurologic: gait normal but favors RLE; normal reflexes of RLE; normal sensation of RLE; normal strength of RLE Psychological: alert and cooperative; normal mood and affect   Allergies  Allergen Reactions  . Shellfish Allergy     Facial swelling, itching    Past Medical History:  Diagnosis Date  . Hypertension    Social History   Socioeconomic History  . Marital status: Single    Spouse name: Not on file  . Number of children: Not on file  . Years of education: Not on file  . Highest education level: Not on file  Occupational History  . Not on file  Social Needs  . Financial resource strain: Not on file  . Food insecurity    Worry: Not on file    Inability: Not on file  . Transportation needs    Medical: Not on file    Non-medical: Not on file  Tobacco Use  . Smoking status: Current Every Day Smoker    Packs/day: 0.25  . Smokeless tobacco: Never Used  Substance and Sexual Activity  . Alcohol use: Yes  . Drug use: No  . Sexual activity: Yes  Lifestyle  . Physical activity    Days per week: Not on file    Minutes per session: Not on file  . Stress: Not on file  Relationships  . Social connections    Talks on phone: Not on  file    Gets together: Not on file    Attends religious service: Not on file    Active member of club or organization: Not on file    Attends meetings of clubs or organizations: Not on file    Relationship status: Not on file  Other Topics Concern  . Not on file  Social History Narrative  . Not on file   Family History  Problem Relation Age of Onset  . Diabetes Mother   . Hypertension Mother   . Diabetes Father   . Hypertension Father    Past Surgical History:  Procedure Laterality Date  . BACK SURGERY        [name], [name],  MD 10/18/18 [phone]

## 2019-04-10 ENCOUNTER — Emergency Department (HOSPITAL_COMMUNITY)
Admission: EM | Admit: 2019-04-10 | Discharge: 2019-04-10 | Disposition: A | Discharge status: Home / Self Care | Payer: Self-pay | Attending: Emergency Medicine | Admitting: Emergency Medicine

## 2019-04-10 ENCOUNTER — Other Ambulatory Visit: Payer: Self-pay

## 2019-04-10 ENCOUNTER — Encounter (HOSPITAL_COMMUNITY): Payer: Self-pay

## 2019-04-10 DIAGNOSIS — F17290 Nicotine dependence, other tobacco product, uncomplicated: Secondary | ICD-10-CM | POA: Insufficient documentation

## 2019-04-10 DIAGNOSIS — I10 Essential (primary) hypertension: Secondary | ICD-10-CM | POA: Insufficient documentation

## 2019-04-10 DIAGNOSIS — Z79899 Other long term (current) drug therapy: Secondary | ICD-10-CM | POA: Insufficient documentation

## 2019-04-10 DIAGNOSIS — K529 Noninfective gastroenteritis and colitis, unspecified: Secondary | ICD-10-CM | POA: Insufficient documentation

## 2019-04-10 LAB — URINALYSIS, ROUTINE W REFLEX MICROSCOPIC
Bilirubin Urine: NEGATIVE
Glucose, UA: NEGATIVE mg/dL
Hgb urine dipstick: NEGATIVE
Ketones, ur: NEGATIVE mg/dL
Leukocytes,Ua: NEGATIVE
Nitrite: NEGATIVE
Protein, ur: NEGATIVE mg/dL
Specific Gravity, Urine: 1.021 (ref 1.005–1.030)
pH: 8 (ref 5.0–8.0)

## 2019-04-10 LAB — CBC
HCT: 48.3 % (ref 39.0–52.0)
Hemoglobin: 15.9 g/dL (ref 13.0–17.0)
MCH: 29.3 pg (ref 26.0–34.0)
MCHC: 32.9 g/dL (ref 30.0–36.0)
MCV: 89 fL (ref 80.0–100.0)
Platelets: 219 10*3/uL (ref 150–400)
RBC: 5.43 MIL/uL (ref 4.22–5.81)
RDW: 12.4 % (ref 11.5–15.5)
WBC: 7 10*3/uL (ref 4.0–10.5)
nRBC: 0 % (ref 0.0–0.2)

## 2019-04-10 LAB — COMPREHENSIVE METABOLIC PANEL
ALT: 41 U/L (ref 0–44)
AST: 32 U/L (ref 15–41)
Albumin: 4.1 g/dL (ref 3.5–5.0)
Alkaline Phosphatase: 80 U/L (ref 38–126)
Anion gap: 7 (ref 5–15)
BUN: 12 mg/dL (ref 6–20)
CO2: 26 mmol/L (ref 22–32)
Calcium: 9.4 mg/dL (ref 8.9–10.3)
Chloride: 109 mmol/L (ref 98–111)
Creatinine, Ser: 0.88 mg/dL (ref 0.61–1.24)
GFR calc Af Amer: >60 mL/min (ref >60)
GFR calc non Af Amer: >60 mL/min (ref >60)
Glucose, Bld: 87 mg/dL (ref 70–99)
Potassium: 4.2 mmol/L (ref 3.5–5.1)
Sodium: 142 mmol/L (ref 135–145)
Total Bilirubin: 0.6 mg/dL (ref 0.3–1.2)
Total Protein: 7.4 g/dL (ref 6.5–8.1)

## 2019-04-10 LAB — LIPASE, BLOOD: Lipase: 18 U/L (ref 11–51)

## 2019-04-10 MED ORDER — FAMOTIDINE IN NACL 20-0.9 MG/50ML-% IV SOLN
20.0000 mg | Freq: Once | INTRAVENOUS | Status: AC
  Administered 2019-04-10: 16:00:00 20 mg via INTRAVENOUS
  Filled 2019-04-10: qty 50

## 2019-04-10 MED ORDER — SODIUM CHLORIDE 0.9% FLUSH: 3.0000 mL | Freq: Once | INTRAVENOUS | Status: DC

## 2019-04-10 MED ORDER — ONDANSETRON HCL 4 MG/2ML IJ SOLN
4.0000 mg | Freq: Once | INTRAMUSCULAR | Status: AC
  Administered 2019-04-10: 16:00:00 4 mg via INTRAVENOUS
  Filled 2019-04-10: qty 2

## 2019-04-10 MED ORDER — SODIUM CHLORIDE 0.9 % IV BOLUS
1000.0000 mL | Freq: Once | INTRAVENOUS | Status: AC
  Administered 2019-04-10: 16:00:00 1000 mL via INTRAVENOUS

## 2019-04-10 MED ORDER — ALUM & MAG HYDROXIDE-SIMETH 200-200-20 MG/5ML PO SUSP
30.0000 mL | Freq: Once | ORAL | Status: AC
  Administered 2019-04-10: 16:00:00 30 mL via ORAL
  Filled 2019-04-10: qty 30

## 2019-04-10 MED ORDER — AMLODIPINE BESYLATE 5 MG PO TABS: 5.0000 mg | ORAL_TABLET | Freq: Every day | ORAL | 0 refills | Status: AC

## 2019-04-10 MED ORDER — ONDANSETRON HCL 4 MG PO TABS: 4.0000 mg | ORAL_TABLET | Freq: Four times a day (QID) | ORAL | 0 refills | Status: DC

## 2019-04-10 NOTE — Discharge Instructions (Signed)

## 2019-04-10 NOTE — ED Triage Notes (Signed)
Patient states he was told he was allergic to iodine and was told he could eat a small amount of shrimp. Patient states he ate shrimp yesterday and has been having emesis and diarrhea since.

## 2019-04-10 NOTE — ED Provider Notes (Signed)
[facility]-EMERGENCY DEPT Provider Note   CSN: [id number] Arrival date & time: 04/10/19  1513     History Chief Complaint  Patient presents with  . Emesis  . Diarrhea    Lee Garcia is a 34 y.o. male.  HPI   Pt is a 34 y/o male with a h/o HTN who presents to the ED today for eval of nausea and vomiting (x3) that started last night about 2 hours after dinner. He also reports some diarrhea as well. Denies hematemesis, bloody stools, or emesis. He reports mild epigastric abd pain that seems to be intermittent. It seems to occur before he has a BM. He also reports chills and fatigue.  He ate fried rice, chicken, and shrimp last night for dinner.   He reports that he has a history of hypertension but is no longer established with a PCP therefore has not had his medications in some time.  Past Medical History:  Diagnosis Date  . Hypertension     Patient Active Problem List   Diagnosis Date Noted  . Left-sided low back pain without sciatica 10/07/2013  . Essential hypertension 10/07/2013  . ANXIETY 09/21/2008  . PALPITATIONS 09/21/2008    Past Surgical History:  Procedure Laterality Date  . BACK SURGERY         Family History  Problem Relation Age of Onset  . Diabetes Mother   . Hypertension Mother   . Diabetes Father   . Hypertension Father     Social History   Tobacco Use  . Smoking status: Current Some Day Smoker    Types: Cigars  . Smokeless tobacco: Never Used  Substance Use Topics  . Alcohol use: Yes  . Drug use: No    Home Medications Prior to Admission medications   Medication Sig Start Date End Date Taking? Authorizing Provider  amLODipine (NORVASC) 5 MG tablet Take 1 tablet (5 mg total) by mouth daily. 04/10/19 05/10/19  Jayel Inks S, PA-C  amoxicillin (AMOXIL) 500 MG capsule Take 1 capsule (500 mg total) by mouth 3 (three) times daily. Patient not taking: Reported on 04/10/2019 12/08/17   Antony Madura, PA-C    HYDROcodone-acetaminophen (NORCO/VICODIN) 5-325 MG tablet Take 1-2 tablets by mouth every 8 (eight) hours as needed for severe pain. Patient not taking: Reported on 04/10/2019 12/08/17   Antony Madura, PA-C  lidocaine (LIDODERM) 5 % Place 1 patch onto the skin daily. Remove & Discard patch within 12 hours or as directed by MD Patient not taking: Reported on 04/10/2019 06/24/15   Joy, Hillard Danker, PA-C  methocarbamol (ROBAXIN) 500 MG tablet Take 1 tablet (500 mg total) by mouth 2 (two) times daily. Patient not taking: Reported on 04/10/2019 06/24/15   Joy, Ines Bloomer C, PA-C  naproxen (NAPROSYN) 500 MG tablet Take 1 tablet (500 mg total) by mouth 2 (two) times daily. Patient not taking: Reported on 04/10/2019 03/19/16   Janne Napoleon, NP  ondansetron (ZOFRAN) 4 MG tablet Take 1 tablet (4 mg total) by mouth every 6 (six) hours. 04/10/19   Tymika Grilli S, PA-C    Allergies    Iodine and Shellfish allergy  Review of Systems   Review of Systems  Constitutional: Positive for chills. Negative for fever.  HENT: Negative for ear pain and sore throat.   Eyes: Negative for pain and visual disturbance.  Respiratory: Negative for cough and shortness of breath.   Cardiovascular: Negative for chest pain.  Gastrointestinal: Positive for abdominal pain, diarrhea, nausea and vomiting.  Genitourinary: Negative for dysuria and hematuria.  Musculoskeletal: Negative for back pain.  Skin: Negative for rash.  Neurological: Negative for headaches.  All other systems reviewed and are negative.   Physical Exam Updated Vital Signs BP (!) 151/98   Pulse 60   Temp 98.1 F (36.7 C) (Oral)   Resp 15   Ht 5\' 6"  (1.676 m)   Wt 108.9 kg   SpO2 100%   BMI 38.74 kg/m   Physical Exam Vitals and nursing note reviewed.  Constitutional:      Appearance: He is well-developed.  HENT:     Head: Normocephalic and atraumatic.  Eyes:     Conjunctiva/sclera: Conjunctivae normal.  Cardiovascular:     Rate and Rhythm: Normal rate  and regular rhythm.     Heart sounds: Normal heart sounds. No murmur.  Pulmonary:     Effort: Pulmonary effort is normal. No respiratory distress.     Breath sounds: Normal breath sounds. No wheezing, rhonchi or rales.  Abdominal:     General: Bowel sounds are normal. There is no distension.     Palpations: Abdomen is soft.     Tenderness: There is no abdominal tenderness. There is no right CVA tenderness, left CVA tenderness, guarding or rebound.  Musculoskeletal:     Cervical back: Neck supple.  Skin:    General: Skin is warm and dry.  Neurological:     Mental Status: He is alert.     ED Results / Procedures / Treatments   Labs (all labs ordered are listed, but only abnormal results are displayed) Labs Reviewed  LIPASE, BLOOD  COMPREHENSIVE METABOLIC PANEL  CBC  URINALYSIS, ROUTINE W REFLEX MICROSCOPIC    EKG None  Radiology No results found.  Procedures Procedures (including critical care time)  Medications Ordered in ED Medications  sodium chloride flush (NS) 0.9 % injection 3 mL (has no administration in time range)  sodium chloride 0.9 % bolus 1,000 mL (1,000 mLs Intravenous New Bag/Given 04/10/19 1613)  ondansetron (ZOFRAN) injection 4 mg (4 mg Intravenous Given 04/10/19 1613)  famotidine (PEPCID) IVPB 20 mg premix (0 mg Intravenous Stopped 04/10/19 1652)  alum & mag hydroxide-simeth (MAALOX/MYLANTA) 200-200-20 MG/5ML suspension 30 mL (30 mLs Oral Given 04/10/19 1613)    ED Course  I have reviewed the triage vital signs and the nursing notes.  Pertinent labs & imaging results that were available during my care of the patient were reviewed by me and considered in my medical decision making (see chart for details).    MDM Rules/Calculators/A&P                      Patient with symptoms consistent with viral gastroenteritis.  Vitals are stable, no fever.  Reviewed labs which were all unremarkable.  CBC was without leukocytosis or anemia.  CMP with normal  electrolytes kidney and liver function.  Lipase negative.  UA negative for UTI.  No signs of dehydration, tolerating PO fluids > 6 oz.  Lungs are clear.  No focal abdominal pain, no concern for appendicitis, cholecystitis, pancreatitis, ruptured viscus, UTI, kidney stone, or any other abdominal etiology.  Supportive therapy indicated with return if symptoms worsen.  Patient counseled.  Also noted to have elevated blood pressure today without any symptoms to suggest hypertensive emergency.  We will give him Rx for blood pressure medication and in full remission for PCP.  Advised to return to the ED for new or worsening symptoms.  He voices understanding of the  plan and reasons to return.  All questions answered patient stable for discharge.  Final Clinical Impression(s) / ED Diagnoses Final diagnoses:  Gastroenteritis  Hypertension, unspecified type    Rx / DC Orders ED Discharge Orders         Ordered    ondansetron (ZOFRAN) 4 MG tablet  Every 6 hours     04/10/19 1700    amLODipine (NORVASC) 5 MG tablet  Daily     04/10/19 1700           Rodney Booze, PA-C 04/10/19 1701    Davonna Belling, MD 04/10/19 2158

## 2019-06-26 ENCOUNTER — Other Ambulatory Visit: Payer: Self-pay

## 2019-06-26 ENCOUNTER — Emergency Department (HOSPITAL_COMMUNITY)
Admission: EM | Admit: 2019-06-26 | Discharge: 2019-06-26 | Disposition: A | Discharge status: Home / Self Care | Payer: Self-pay | Attending: Emergency Medicine | Admitting: Emergency Medicine

## 2019-06-26 ENCOUNTER — Encounter (HOSPITAL_COMMUNITY): Payer: Self-pay

## 2019-06-26 DIAGNOSIS — Y929 Unspecified place or not applicable: Secondary | ICD-10-CM | POA: Insufficient documentation

## 2019-06-26 DIAGNOSIS — Y9389 Activity, other specified: Secondary | ICD-10-CM | POA: Insufficient documentation

## 2019-06-26 DIAGNOSIS — S39012A Strain of muscle, fascia and tendon of lower back, initial encounter: Secondary | ICD-10-CM | POA: Insufficient documentation

## 2019-06-26 DIAGNOSIS — X500XXA Overexertion from strenuous movement or load, initial encounter: Secondary | ICD-10-CM | POA: Insufficient documentation

## 2019-06-26 DIAGNOSIS — Y990 Civilian activity done for income or pay: Secondary | ICD-10-CM | POA: Insufficient documentation

## 2019-06-26 DIAGNOSIS — F17290 Nicotine dependence, other tobacco product, uncomplicated: Secondary | ICD-10-CM | POA: Insufficient documentation

## 2019-06-26 MED ORDER — PREDNISONE 20 MG PO TABS
60.0000 mg | ORAL_TABLET | ORAL | Status: AC
  Administered 2019-06-26: 60 mg via ORAL
  Filled 2019-06-26: qty 3

## 2019-06-26 MED ORDER — PREDNISONE 20 MG PO TABS: 40.0000 mg | ORAL_TABLET | Freq: Every day | ORAL | 0 refills | Status: DC

## 2019-06-26 MED ORDER — TIZANIDINE HCL 2 MG PO CAPS: 2.0000 mg | ORAL_CAPSULE | Freq: Three times a day (TID) | ORAL | 0 refills | Status: AC

## 2019-06-26 NOTE — ED Provider Notes (Signed)
[facility]-EMERGENCY DEPT Provider Note   CSN: [id number] Arrival date & time: 06/26/19  1946     History Chief Complaint  Patient presents with  . Back Pain    Lee Garcia is a 34 y.o. male.  HPI    Patient presents with low back pain. Patient has a history of similar pain going back years, but sustained an aggravating event yesterday, with worsening of his pain. Pain is in the low back bilaterally, with inferior radiation. There is no new extremity weakness, no incontinence, no abdominal pain, no fever, no fall. 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 Since that time he has had increasing pain in the area, sore, severe, worse with ambulation. He notes that he has previously been on medication courses but is not taking anything currently. Patient is otherwise generally well, though acknowledges a history of hypertension.  Past Medical History:  Diagnosis Date  . Hypertension     Patient Active Problem List   Diagnosis Date Noted  . Left-sided low back pain without sciatica 10/07/2013  . Essential hypertension 10/07/2013  . ANXIETY 09/21/2008  . PALPITATIONS 09/21/2008    Past Surgical History:  Procedure Laterality Date  . BACK SURGERY         Family History  Problem Relation Age of Onset  . Diabetes Mother   . Hypertension Mother   . Diabetes Father   . Hypertension Father     Social History   Tobacco Use  . Smoking status: Current Some Day Smoker    Types: Cigars  . Smokeless tobacco: Never Used  Substance Use Topics  . Alcohol use: Yes  . Drug use: No    Home Medications Prior to Admission medications   Medication Sig Start Date End Date Taking? Authorizing Provider  amLODipine (NORVASC) 5 MG tablet Take 1 tablet (5 mg total) by mouth daily. 04/10/19 05/10/19  Couture, Cortni S, PA-C  amoxicillin (AMOXIL) 500 MG capsule Take 1 capsule (500 mg total) by mouth 3  (three) times daily. Patient not taking: Reported on 04/10/2019 12/08/17   Antony Madura, PA-C  HYDROcodone-acetaminophen (NORCO/VICODIN) 5-325 MG tablet Take 1-2 tablets by mouth every 8 (eight) hours as needed for severe pain. Patient not taking: Reported on 04/10/2019 12/08/17   Antony Madura, PA-C  lidocaine (LIDODERM) 5 % Place 1 patch onto the skin daily. Remove & Discard patch within 12 hours or as directed by MD Patient not taking: Reported on 04/10/2019 06/24/15   Joy, Hillard Danker, PA-C  methocarbamol (ROBAXIN) 500 MG tablet Take 1 tablet (500 mg total) by mouth 2 (two) times daily. Patient not taking: Reported on 04/10/2019 06/24/15   Joy, Ines Bloomer C, PA-C  naproxen (NAPROSYN) 500 MG tablet Take 1 tablet (500 mg total) by mouth 2 (two) times daily. Patient not taking: Reported on 04/10/2019 03/19/16   Janne Napoleon, NP  ondansetron (ZOFRAN) 4 MG tablet Take 1 tablet (4 mg total) by mouth every 6 (six) hours. 04/10/19   Couture, Cortni S, PA-C    Allergies    Iodine and Shellfish allergy  Review of Systems   Review of Systems  Constitutional:       Per HPI, otherwise negative  HENT:       Per HPI, otherwise negative  Respiratory:       Per HPI, otherwise negative  Cardiovascular:       Per HPI, otherwise negative  Gastrointestinal: Negative for vomiting.  Endocrine:       Negative aside from HPI  Genitourinary:       Neg aside from HPI   Musculoskeletal:       Per HPI, otherwise negative  Skin: Negative.   Neurological: Negative for syncope.    Physical Exam Updated Vital Signs BP (!) 169/106 (BP Location: Left Arm)   Pulse (!) 112   Temp 98.3 F (36.8 C) (Oral)   Resp 18   Ht 5\' 7"  (1.702 m)   Wt 109.3 kg   SpO2 98%   BMI 37.75 kg/m   Physical Exam Vitals and nursing note reviewed.  Constitutional:      General: He is not in acute distress.    Appearance: He is well-developed.  HENT:     Head: Normocephalic and atraumatic.  Eyes:     Conjunctiva/sclera: Conjunctivae  normal.  Cardiovascular:     Rate and Rhythm: Normal rate and regular rhythm.  Pulmonary:     Effort: Pulmonary effort is normal. No respiratory distress.     Breath sounds: No stridor.  Abdominal:     General: There is no distension.  Musculoskeletal:       Arms:     Comments: Patient can flex each hip independently, has slightly diminished strength left compared to right, both proximal and distal throughout the leg.  Skin:    General: Skin is warm and dry.  Neurological:     Mental Status: He is alert and oriented to person, place, and time.     ED Results / Procedures / Treatments    Procedures Procedures (including critical care time)  Medications Ordered in ED Medications  predniSONE (DELTASONE) tablet 60 mg (has no administration in time range)    ED Course  I have reviewed the triage vital signs and the nursing notes.  Pertinent labs & imaging results that were available during my care of the patient were reviewed by me and considered in my medical decision making (see chart for details).  This generally well-appearing adult male with a history of back pain presents after exacerbation sustained yesterday. Here he is awake, alert, afebrile, speaking clearly, with no red flags suggesting cauda equina or other CNS pathology. Some suspicion for musculoskeletal etiology given these findings. Patient amenable to initiation of medication course, will follow up with our local primary care clinic, and with nearby physical therapy center as well.  Final Clinical Impression(s) / ED Diagnoses Final diagnoses:  Strain of lumbar region, initial encounter    Rx / DC Orders ED Discharge Orders         Ordered    predniSONE (DELTASONE) 20 MG tablet  Daily with breakfast     06/26/19 2034    tizanidine (ZANAFLEX) 2 MG capsule  3 times daily     06/26/19 2034           Carmin Muskrat, MD 06/26/19 2034

## 2019-06-26 NOTE — ED Triage Notes (Signed)
Lower back pain since last night. Recurrent.

## 2019-06-26 NOTE — Discharge Instructions (Addendum)
As discussed, your evaluation today has been largely reassuring.  But, it is important that you monitor your condition carefully, and do not hesitate to return to the ED if you develop new, or concerning changes in your condition.  In addition to the prescribed medication, please obtain and use medicated patches including the ingredients lidocaine and methyl salicylate for additional pain relief.  These should be applied to the midline lower back area, and changed every 12 hours. Patches are available at any local pharmacy.

## 2019-07-02 ENCOUNTER — Encounter (HOSPITAL_COMMUNITY): Payer: Self-pay | Admitting: Emergency Medicine

## 2019-07-02 ENCOUNTER — Emergency Department (HOSPITAL_COMMUNITY): Payer: Self-pay

## 2019-07-02 ENCOUNTER — Other Ambulatory Visit: Payer: Self-pay

## 2019-07-02 DIAGNOSIS — M79644 Pain in right finger(s): Secondary | ICD-10-CM | POA: Insufficient documentation

## 2019-07-02 DIAGNOSIS — I10 Essential (primary) hypertension: Secondary | ICD-10-CM | POA: Insufficient documentation

## 2019-07-02 DIAGNOSIS — F17290 Nicotine dependence, other tobacco product, uncomplicated: Secondary | ICD-10-CM | POA: Insufficient documentation

## 2019-07-02 NOTE — ED Triage Notes (Signed)
Patient c/o right hand pain after leash pulled fingers while walking dog. Reports burning sensation to hand.

## 2019-07-03 ENCOUNTER — Emergency Department (HOSPITAL_COMMUNITY)
Admission: EM | Admit: 2019-07-03 | Discharge: 2019-07-03 | Disposition: A | Discharge status: Home / Self Care | Payer: Self-pay | Attending: Emergency Medicine | Admitting: Emergency Medicine

## 2019-07-03 DIAGNOSIS — M79644 Pain in right finger(s): Secondary | ICD-10-CM

## 2019-07-03 MED ORDER — KETOROLAC TROMETHAMINE 60 MG/2ML IM SOLN
60.0000 mg | Freq: Once | INTRAMUSCULAR | Status: DC
  Filled 2019-07-03: qty 2

## 2019-07-03 MED ORDER — OXYCODONE-ACETAMINOPHEN 5-325 MG PO TABS
1.0000 | ORAL_TABLET | Freq: Once | ORAL | Status: AC
  Administered 2019-07-03: 1 via ORAL
  Filled 2019-07-03: qty 1

## 2019-07-03 NOTE — ED Provider Notes (Signed)
[facility]-EMERGENCY DEPT Provider Note   CSN: [id number] Arrival date & time: 07/02/19  2026     History Chief Complaint  Patient presents with  . Hand Pain    Kayvion Arneson is a 34 y.o. male.  He got his right middle finger pulled on by a chain when his dog was trying to get off of the and has had burning in double pain since that time along with some swelling.   Hand Pain This is a new problem. The current episode started 6 to 12 hours ago. The problem occurs constantly. The problem has not changed since onset.Pertinent negatives include no chest pain. Nothing aggravates the symptoms. Nothing relieves the symptoms.       Past Medical History:  Diagnosis Date  . Hypertension     Patient Active Problem List   Diagnosis Date Noted  . Left-sided low back pain without sciatica 10/07/2013  . Essential hypertension 10/07/2013  . ANXIETY 09/21/2008  . PALPITATIONS 09/21/2008    Past Surgical History:  Procedure Laterality Date  . BACK SURGERY         Family History  Problem Relation Age of Onset  . Diabetes Mother   . Hypertension Mother   . Diabetes Father   . Hypertension Father     Social History   Tobacco Use  . Smoking status: Current Some Day Smoker    Types: Cigars  . Smokeless tobacco: Never Used  Substance Use Topics  . Alcohol use: Yes  . Drug use: No    Home Medications Prior to Admission medications   Medication Sig Start Date End Date Taking? Authorizing Provider  amLODipine (NORVASC) 5 MG tablet Take 1 tablet (5 mg total) by mouth daily. 04/10/19 05/10/19  Couture, Cortni S, PA-C  ondansetron (ZOFRAN) 4 MG tablet Take 1 tablet (4 mg total) by mouth every 6 (six) hours. 04/10/19   Couture, Cortni S, PA-C  predniSONE (DELTASONE) 20 MG tablet Take 2 tablets (40 mg total) by mouth daily with breakfast. For the next four days 06/26/19   Gerhard Munch, MD    Allergies    Iodine and Shellfish allergy  Review of  Systems   Review of Systems  Cardiovascular: Negative for chest pain.  All other systems reviewed and are negative.   Physical Exam Updated Vital Signs BP (!) 147/108   Pulse 69   Temp 98.7 F (37.1 C) (Oral)   Resp 16   SpO2 100%   Physical Exam Vitals and nursing note reviewed.  Constitutional:      Appearance: He is well-developed.  HENT:     Head: Normocephalic and atraumatic.     Mouth/Throat:     Mouth: Mucous membranes are dry.     Pharynx: Oropharynx is clear.  Eyes:     Extraocular Movements: Extraocular movements intact.     Conjunctiva/sclera: Conjunctivae normal.  Cardiovascular:     Rate and Rhythm: Normal rate.  Pulmonary:     Effort: Pulmonary effort is normal. No respiratory distress.  Abdominal:     General: There is no distension.  Musculoskeletal:        General: Swelling and tenderness (right middle finger) present. Normal range of motion.     Cervical back: Normal range of motion.  Skin:    General: Skin is warm and dry.     Coloration: Skin is not jaundiced.  Neurological:     General: No focal deficit present.     Mental Status: He is alert.  ED Results / Procedures / Treatments   Labs (all labs ordered are listed, but only abnormal results are displayed) Labs Reviewed - No data to display  EKG None  Radiology DG Hand Complete Right  Result Date: 07/02/2019 CLINICAL DATA:  Injury pain EXAM: RIGHT HAND - COMPLETE 3+ VIEW COMPARISON:  None. FINDINGS: There is no evidence of fracture or dislocation. There is no evidence of arthropathy or other focal bone abnormality. Mild dorsal soft tissue swelling. IMPRESSION: Negative. Electronically Signed   By: Prudencio Pair M.D.   On: 07/02/2019 21:42    Procedures Procedures (including critical care time)  Medications Ordered in ED Medications  ketorolac (TORADOL) injection 60 mg (60 mg Intramuscular Refused 07/03/19 0654)  oxyCODONE-acetaminophen (PERCOCET/ROXICET) 5-325 MG per tablet 1  tablet (1 tablet Oral Given 07/03/19 9741)    ED Course  I have reviewed the triage vital signs and the nursing notes.  Pertinent labs & imaging results that were available during my care of the patient were reviewed by me and considered in my medical decision making (see chart for details).    MDM Rules/Calculators/A&P                      X-rays negative for any injury probably sprained.  Can move it I suspect he is neurologically intact.  May be a ligament disruption however nothing emergently needs to be seen by hand surgery.  Suspect is probably more related to pain anyway.  He will follow-up with hand surgery if when the swelling and pain improve he still cannot flex it fully.  Final Clinical Impression(s) / ED Diagnoses Final diagnoses:  Finger pain, right    Rx / DC Orders ED Discharge Orders    None       Beadie Matsunaga, Corene Cornea, MD 07/03/19 [phone]

## 2020-02-14 ENCOUNTER — Encounter (HOSPITAL_COMMUNITY): Payer: Self-pay

## 2020-02-14 ENCOUNTER — Emergency Department (HOSPITAL_COMMUNITY)
Admission: EM | Admit: 2020-02-14 | Discharge: 2020-02-14 | Disposition: A | Discharge status: Home / Self Care | Payer: Managed Care, Other (non HMO) | Attending: Emergency Medicine | Admitting: Emergency Medicine

## 2020-02-14 DIAGNOSIS — F17290 Nicotine dependence, other tobacco product, uncomplicated: Secondary | ICD-10-CM | POA: Insufficient documentation

## 2020-02-14 DIAGNOSIS — K529 Noninfective gastroenteritis and colitis, unspecified: Secondary | ICD-10-CM | POA: Insufficient documentation

## 2020-02-14 DIAGNOSIS — R109 Unspecified abdominal pain: Secondary | ICD-10-CM | POA: Diagnosis present

## 2020-02-14 LAB — URINALYSIS, ROUTINE W REFLEX MICROSCOPIC
Bilirubin Urine: NEGATIVE
Glucose, UA: NEGATIVE mg/dL
Hgb urine dipstick: NEGATIVE
Ketones, ur: 5 mg/dL — AB
Leukocytes,Ua: NEGATIVE
Nitrite: NEGATIVE
Protein, ur: NEGATIVE mg/dL
Specific Gravity, Urine: 1.032 — ABNORMAL HIGH (ref 1.005–1.030)
pH: 5 (ref 5.0–8.0)

## 2020-02-14 LAB — CBC
HCT: 49.4 % (ref 39.0–52.0)
Hemoglobin: 16.2 g/dL (ref 13.0–17.0)
MCH: 29.8 pg (ref 26.0–34.0)
MCHC: 32.8 g/dL (ref 30.0–36.0)
MCV: 91 fL (ref 80.0–100.0)
Platelets: 208 10*3/uL (ref 150–400)
RBC: 5.43 MIL/uL (ref 4.22–5.81)
RDW: 12.7 % (ref 11.5–15.5)
WBC: 6.1 10*3/uL (ref 4.0–10.5)
nRBC: 0 % (ref 0.0–0.2)

## 2020-02-14 LAB — COMPREHENSIVE METABOLIC PANEL
ALT: 26 U/L (ref 0–44)
AST: 26 U/L (ref 15–41)
Albumin: 4.3 g/dL (ref 3.5–5.0)
Alkaline Phosphatase: 78 U/L (ref 38–126)
Anion gap: 10 (ref 5–15)
BUN: 16 mg/dL (ref 6–20)
CO2: 25 mmol/L (ref 22–32)
Calcium: 9 mg/dL (ref 8.9–10.3)
Chloride: 104 mmol/L (ref 98–111)
Creatinine, Ser: 0.88 mg/dL (ref 0.61–1.24)
GFR, Estimated: >60 mL/min (ref >60)
Glucose, Bld: 98 mg/dL (ref 70–99)
Potassium: 3.8 mmol/L (ref 3.5–5.1)
Sodium: 139 mmol/L (ref 135–145)
Total Bilirubin: 0.5 mg/dL (ref 0.3–1.2)
Total Protein: 7.4 g/dL (ref 6.5–8.1)

## 2020-02-14 LAB — LIPASE, BLOOD: Lipase: 19 U/L (ref 11–51)

## 2020-02-14 MED ORDER — ONDANSETRON HCL 4 MG/2ML IJ SOLN
4.0000 mg | Freq: Once | INTRAMUSCULAR | Status: AC
  Administered 2020-02-14: 08:00:00 4 mg via INTRAVENOUS
  Filled 2020-02-14: qty 2

## 2020-02-14 MED ORDER — ONDANSETRON 4 MG PO TBDP: 4.0000 mg | ORAL_TABLET | Freq: Three times a day (TID) | ORAL | 0 refills | Status: DC | PRN

## 2020-02-14 MED ORDER — SODIUM CHLORIDE 0.9 % IV BOLUS
1000.0000 mL | Freq: Once | INTRAVENOUS | Status: AC
  Administered 2020-02-14: 08:00:00 1000 mL via INTRAVENOUS

## 2020-02-14 NOTE — ED Triage Notes (Signed)
Pt complains of stomach pain, vomiting and diarrhea for two days Pt states he had a chicken sub from [employer] for dinner and he woke up having vomiting and diarrhea

## 2020-02-14 NOTE — Discharge Instructions (Signed)
If you develop worsening, continued, or recurrent abdominal pain, uncontrolled vomiting, fever, chest or back pain, or any other new/concerning symptoms then return to the ER for evaluation.  

## 2020-02-14 NOTE — ED Provider Notes (Signed)
[facility]-EMERGENCY DEPT Provider Note   CSN: [id number] Arrival date & time: 02/14/20  9518     History No chief complaint on file.   Lee Garcia is a 34 y.o. male.  HPI 34 year old male presents with abdominal discomfort, vomiting, and diarrhea.  Last night he went to bed early but then woke up around 1045 with emesis.  Has had a couple episodes of diarrhea as well.  His abdomen does not hurt but it feels uncomfortable and like it is moving back and forth.  Feels kind of seasick.  No fevers or hematemesis/hematochezia.  No one else has been sick.  No cough.   Past Medical History:  Diagnosis Date  . Hypertension     Patient Active Problem List   Diagnosis Date Noted  . Left-sided low back pain without sciatica 10/07/2013  . Essential hypertension 10/07/2013  . ANXIETY 09/21/2008  . PALPITATIONS 09/21/2008    Past Surgical History:  Procedure Laterality Date  . BACK SURGERY         Family History  Problem Relation Age of Onset  . Diabetes Mother   . Hypertension Mother   . Diabetes Father   . Hypertension Father     Social History   Tobacco Use  . Smoking status: Current Some Day Smoker    Types: Cigars  . Smokeless tobacco: Never Used  Vaping Use  . Vaping Use: Never used  Substance Use Topics  . Alcohol use: Yes  . Drug use: No    Home Medications Prior to Admission medications   Medication Sig Start Date End Date Taking? Authorizing Provider  ondansetron (ZOFRAN ODT) 4 MG disintegrating tablet Take 1 tablet (4 mg total) by mouth every 8 (eight) hours as needed for nausea or vomiting. 02/14/20  Yes Pricilla Loveless, MD  amLODipine (NORVASC) 5 MG tablet Take 1 tablet (5 mg total) by mouth daily. 04/10/19 05/10/19  Couture, Cortni S, PA-C  ondansetron (ZOFRAN) 4 MG tablet Take 1 tablet (4 mg total) by mouth every 6 (six) hours. 04/10/19   Couture, Cortni S, PA-C  predniSONE (DELTASONE) 20 MG tablet Take 2 tablets (40 mg  total) by mouth daily with breakfast. For the next four days 06/26/19   Gerhard Munch, MD    Allergies    Iodine and Shellfish allergy  Review of Systems   Review of Systems  Constitutional: Negative for fever.  Respiratory: Negative for cough.   Gastrointestinal: Positive for diarrhea, nausea and vomiting. Negative for abdominal pain.  All other systems reviewed and are negative.   Physical Exam Updated Vital Signs BP (!) 147/86   Pulse 66   Temp 98.8 F (37.1 C) (Oral)   Resp 20   Ht 5\' 7"  (1.702 m)   Wt 109.3 kg   SpO2 99%   BMI 37.75 kg/m   Physical Exam Vitals and nursing note reviewed.  Constitutional:      General: He is not in acute distress.    Appearance: He is well-developed and well-nourished. He is not ill-appearing or diaphoretic.  HENT:     Head: Normocephalic and atraumatic.     Right Ear: External ear normal.     Left Ear: External ear normal.     Nose: Nose normal.  Eyes:     General:        Right eye: No discharge.        Left eye: No discharge.  Cardiovascular:     Rate and Rhythm: Normal rate and regular  rhythm.     Heart sounds: Normal heart sounds.  Pulmonary:     Effort: Pulmonary effort is normal.     Breath sounds: Normal breath sounds.  Abdominal:     General: There is no distension.     Palpations: Abdomen is soft.     Tenderness: There is no abdominal tenderness.  Musculoskeletal:        General: No edema.     Cervical back: Neck supple.  Skin:    General: Skin is warm and dry.  Neurological:     Mental Status: He is alert.  Psychiatric:        Mood and Affect: Mood is not anxious.     ED Results / Procedures / Treatments   Labs (all labs ordered are listed, but only abnormal results are displayed) Labs Reviewed  URINALYSIS, ROUTINE W REFLEX MICROSCOPIC - Abnormal; Notable for the following components:      Result Value   Specific Gravity, Urine 1.032 (*)    Ketones, ur 5 (*)    All other components within normal  limits  LIPASE, BLOOD  COMPREHENSIVE METABOLIC PANEL  CBC    EKG None  Radiology No results found.  Procedures Procedures (including critical care time)  Medications Ordered in ED Medications  sodium chloride 0.9 % bolus 1,000 mL (1,000 mLs Intravenous New Bag/Given 02/14/20 0745)  ondansetron (ZOFRAN) injection 4 mg (4 mg Intravenous Given 02/14/20 0744)    ED Course  I have reviewed the triage vital signs and the nursing notes.  Pertinent labs & imaging results that were available during my care of the patient were reviewed by me and considered in my medical decision making (see chart for details).    MDM Rules/Calculators/A&P                          Patient's abdominal exam is benign.  This sounds like a viral gastroenteritis.  He is feeling better with Zofran and fluids.  Labs are unremarkable except for minimal ketones in the urine.  At this point he appears stable for discharge home with return precautions. Final Clinical Impression(s) / ED Diagnoses Final diagnoses:  Acute gastroenteritis    Rx / DC Orders ED Discharge Orders         Ordered    ondansetron (ZOFRAN ODT) 4 MG disintegrating tablet  Every 8 hours PRN        02/14/20 0845           Pricilla Loveless, MD 02/14/20 [phone]

## 2020-02-27 ENCOUNTER — Ambulatory Visit (HOSPITAL_COMMUNITY)
Admission: EM | Admit: 2020-02-27 | Discharge: 2020-02-27 | Disposition: A | Discharge status: Home / Self Care | Payer: Managed Care, Other (non HMO) | Attending: Emergency Medicine | Admitting: Emergency Medicine

## 2020-02-27 ENCOUNTER — Other Ambulatory Visit: Payer: Self-pay

## 2020-02-27 ENCOUNTER — Encounter (HOSPITAL_COMMUNITY): Payer: Self-pay

## 2020-02-27 DIAGNOSIS — U071 COVID-19: Secondary | ICD-10-CM | POA: Insufficient documentation

## 2020-02-27 DIAGNOSIS — B349 Viral infection, unspecified: Secondary | ICD-10-CM | POA: Diagnosis not present

## 2020-02-27 LAB — SARS CORONAVIRUS 2 (TAT 6-24 HRS): SARS Coronavirus 2: POSITIVE — AB

## 2020-02-27 NOTE — ED Triage Notes (Signed)
Patient states about 3 days ago he began having an itchy throat. Patient states he has had body aches since yesterday as well. Pt is aox4 and ambulatory.

## 2020-02-27 NOTE — ED Provider Notes (Signed)
[facility]    CSN: [id number] Arrival date & time: 02/27/20  0830      History   Chief Complaint Chief Complaint  Patient presents with  . Sore Throat    Itchy x 3 days    HPI Lee Garcia is a 35 y.o. male.   Patient presents with 2 to 3-day history of itchy throat.  He also reports body aches, mild nonproductive cough, and mild diarrhea.  T-max 99.5 at home.  He denies rash, congestion, shortness of breath, vomiting, or other symptoms.  OTC treatment attempted at home.  The history is provided by the patient and medical records.    Past Medical History:  Diagnosis Date  . Hypertension     Patient Active Problem List   Diagnosis Date Noted  . Left-sided low back pain without sciatica 10/07/2013  . Essential hypertension 10/07/2013  . ANXIETY 09/21/2008  . PALPITATIONS 09/21/2008    Past Surgical History:  Procedure Laterality Date  . BACK SURGERY         Home Medications    Prior to Admission medications   Medication Sig Start Date End Date Taking? Authorizing Provider  amLODipine (NORVASC) 5 MG tablet Take 1 tablet (5 mg total) by mouth daily. 04/10/19 05/10/19  Couture, Cortni S, PA-C  ondansetron (ZOFRAN ODT) 4 MG disintegrating tablet Take 1 tablet (4 mg total) by mouth every 8 (eight) hours as needed for nausea or vomiting. 02/14/20   Pricilla Loveless, MD  ondansetron (ZOFRAN) 4 MG tablet Take 1 tablet (4 mg total) by mouth every 6 (six) hours. 04/10/19   Couture, Cortni S, PA-C  predniSONE (DELTASONE) 20 MG tablet Take 2 tablets (40 mg total) by mouth daily with breakfast. For the next four days 06/26/19   Gerhard Munch, MD    Family History Family History  Problem Relation Age of Onset  . Diabetes Mother   . Hypertension Mother   . Diabetes Father   . Hypertension Father     Social History Social History   Tobacco Use  . Smoking status: Current Some Day Smoker    Types: Cigars  . Smokeless tobacco: Never Used  Vaping Use   . Vaping Use: Never used  Substance Use Topics  . Alcohol use: Yes  . Drug use: No     Allergies   Iodine and Shellfish allergy   Review of Systems Review of Systems  Constitutional: Negative for chills and fever.  HENT: Positive for sore throat. Negative for congestion and ear pain.   Eyes: Negative for pain and visual disturbance.  Respiratory: Positive for cough. Negative for shortness of breath.   Cardiovascular: Negative for chest pain and palpitations.  Gastrointestinal: Positive for diarrhea. Negative for abdominal pain and vomiting.  Genitourinary: Negative for dysuria and hematuria.  Musculoskeletal: Negative for arthralgias and back pain.  Skin: Negative for color change and rash.  Neurological: Negative for seizures and syncope.  All other systems reviewed and are negative.    Physical Exam Triage Vital Signs ED Triage Vitals  Enc Vitals Group     BP 02/27/20 0952 138/80     Pulse Rate 02/27/20 0952 85     Resp 02/27/20 0952 17     Temp 02/27/20 0952 98.7 F (37.1 C)     Temp Source 02/27/20 0952 Oral     SpO2 02/27/20 0952 100 %     Weight --      Height --      Head Circumference --  Peak Flow --      Pain Score 02/27/20 0955 0     Pain Loc --      Pain Edu? --      Excl. in GC? --    No data found.  Updated Vital Signs BP 138/80 (BP Location: Right Arm)   Pulse 85   Temp 98.7 F (37.1 C) (Oral)   Resp 17   SpO2 100%   Visual Acuity Right Eye Distance:   Left Eye Distance:   Bilateral Distance:    Right Eye Near:   Left Eye Near:    Bilateral Near:     Physical Exam Vitals and nursing note reviewed.  Constitutional:      General: He is not in acute distress.    Appearance: He is well-developed and well-nourished. He is not ill-appearing.  HENT:     Head: Normocephalic and atraumatic.     Right Ear: Tympanic membrane normal.     Left Ear: Tympanic membrane normal.     Nose: Nose normal.     Mouth/Throat:     Mouth: Mucous  membranes are moist.     Pharynx: Oropharynx is clear.  Eyes:     Conjunctiva/sclera: Conjunctivae normal.  Cardiovascular:     Rate and Rhythm: Normal rate and regular rhythm.     Heart sounds: Normal heart sounds.  Pulmonary:     Effort: Pulmonary effort is normal. No respiratory distress.     Breath sounds: Normal breath sounds.  Abdominal:     Palpations: Abdomen is soft.     Tenderness: There is no abdominal tenderness.  Musculoskeletal:        General: No edema.     Cervical back: Neck supple.  Skin:    General: Skin is warm and dry.     Findings: No rash.  Neurological:     General: No focal deficit present.     Mental Status: He is alert and oriented to person, place, and time.     Gait: Gait normal.  Psychiatric:        Mood and Affect: Mood and affect and mood normal.        Behavior: Behavior normal.      UC Treatments / Results  Labs (all labs ordered are listed, but only abnormal results are displayed) Labs Reviewed  SARS CORONAVIRUS 2 (TAT 6-24 HRS)    EKG   Radiology No results found.  Procedures Procedures (including critical care time)  Medications Ordered in UC Medications - No data to display  Initial Impression / Assessment and Plan / UC Course  I have reviewed the triage vital signs and the nursing notes.  Pertinent labs & imaging results that were available during my care of the patient were reviewed by me and considered in my medical decision making (see chart for details).   Viral illness.  COVID pending.  Instructed patient to self quarantine until the test results are back.  Discussed symptomatic treatment including Tylenol, rest, hydration.  Instructed patient to follow up with PCP if her symptoms are not improving  Patient agrees to plan of care.    Final Clinical Impressions(s) / UC Diagnoses   Final diagnoses:  Viral illness     Discharge Instructions     Your COVID test is pending.  You should self quarantine until the  test result is back.    Take Tylenol or ibuprofen as needed for fever or discomfort.  Rest and keep yourself hydrated.  Follow-up with your primary care provider if your symptoms are not improving.        ED Prescriptions    None     PDMP not reviewed this encounter.   Mickie Bail, NP 02/27/20 1036

## 2020-02-27 NOTE — Discharge Instructions (Addendum)
Your COVID test is pending.  You should self quarantine until the test result is back.    Take Tylenol or ibuprofen as needed for fever or discomfort.  Rest and keep yourself hydrated.    Follow-up with your primary care provider if your symptoms are not improving.     

## 2020-05-22 ENCOUNTER — Other Ambulatory Visit: Payer: Self-pay

## 2020-05-22 ENCOUNTER — Encounter (HOSPITAL_COMMUNITY): Payer: Self-pay

## 2020-05-22 ENCOUNTER — Emergency Department (HOSPITAL_COMMUNITY)
Admission: EM | Admit: 2020-05-22 | Discharge: 2020-05-22 | Disposition: A | Discharge status: Home / Self Care | Payer: Managed Care, Other (non HMO) | Attending: Emergency Medicine | Admitting: Emergency Medicine

## 2020-05-22 ENCOUNTER — Emergency Department (HOSPITAL_COMMUNITY): Payer: Managed Care, Other (non HMO)

## 2020-05-22 DIAGNOSIS — S6992XA Unspecified injury of left wrist, hand and finger(s), initial encounter: Secondary | ICD-10-CM | POA: Diagnosis present

## 2020-05-22 DIAGNOSIS — S62025A Nondisplaced fracture of middle third of navicular [scaphoid] bone of left wrist, initial encounter for closed fracture: Secondary | ICD-10-CM | POA: Diagnosis not present

## 2020-05-22 DIAGNOSIS — I10 Essential (primary) hypertension: Secondary | ICD-10-CM | POA: Diagnosis not present

## 2020-05-22 DIAGNOSIS — Z79899 Other long term (current) drug therapy: Secondary | ICD-10-CM | POA: Diagnosis not present

## 2020-05-22 DIAGNOSIS — F17290 Nicotine dependence, other tobacco product, uncomplicated: Secondary | ICD-10-CM | POA: Diagnosis not present

## 2020-05-22 DIAGNOSIS — X500XXA Overexertion from strenuous movement or load, initial encounter: Secondary | ICD-10-CM | POA: Diagnosis not present

## 2020-05-22 DIAGNOSIS — Y93F2 Activity, caregiving, lifting: Secondary | ICD-10-CM | POA: Diagnosis not present

## 2020-05-22 NOTE — ED Provider Notes (Signed)
[facility]-EMERGENCY DEPT Provider Note   CSN: [id number] Arrival date & time: 05/22/20  4536     History Chief Complaint  Patient presents with  . Wrist Pain    Lee Garcia is a 35 y.o. male.  HPI Patient presents with pain in the left lateral proximal hand and wrist.  Onset was about 1 month ago.  Initially he does not recall a distinct event, but then, after discussing his x-ray, he notes that he did feel a popping sensation about that time, after lifting a heavy object.  Since that time pain has been present, slightly better, swelling is definitely improved, however with any motion of the hand, particularly gripping or lifting he feels pain in that area.  Pain is sore, severe, not appreciably changed with OTC medication. No other injuries, no other complaints.     Past Medical History:  Diagnosis Date  . Hypertension     Patient Active Problem List   Diagnosis Date Noted  . Left-sided low back pain without sciatica 10/07/2013  . Essential hypertension 10/07/2013  . ANXIETY 09/21/2008  . PALPITATIONS 09/21/2008    Past Surgical History:  Procedure Laterality Date  . BACK SURGERY         Family History  Problem Relation Age of Onset  . Diabetes Mother   . Hypertension Mother   . Diabetes Father   . Hypertension Father     Social History   Tobacco Use  . Smoking status: Current Some Day Smoker    Types: Cigars  . Smokeless tobacco: Never Used  Vaping Use  . Vaping Use: Never used  Substance Use Topics  . Alcohol use: Yes  . Drug use: No    Home Medications Prior to Admission medications   Medication Sig Start Date End Date Taking? Authorizing Provider  amLODipine (NORVASC) 5 MG tablet Take 1 tablet (5 mg total) by mouth daily. 04/10/19 05/10/19  Couture, Cortni S, PA-C  ondansetron (ZOFRAN ODT) 4 MG disintegrating tablet Take 1 tablet (4 mg total) by mouth every 8 (eight) hours as needed for nausea or vomiting. 02/14/20    Pricilla Loveless, MD  ondansetron (ZOFRAN) 4 MG tablet Take 1 tablet (4 mg total) by mouth every 6 (six) hours. 04/10/19   Couture, Cortni S, PA-C  predniSONE (DELTASONE) 20 MG tablet Take 2 tablets (40 mg total) by mouth daily with breakfast. For the next four days 06/26/19   Gerhard Munch, MD    Allergies    Iodine and Shellfish allergy  Review of Systems   Review of Systems  Constitutional:       Per HPI, otherwise negative  HENT:       Per HPI, otherwise negative  Respiratory:       Per HPI, otherwise negative  Cardiovascular:       Per HPI, otherwise negative  Gastrointestinal: Negative for vomiting.  Endocrine:       Negative aside from HPI  Genitourinary:       Neg aside from HPI   Musculoskeletal:       Per HPI, otherwise negative  Skin: Negative.   Neurological: Negative for syncope.    Physical Exam Updated Vital Signs BP (!) 153/94 (BP Location: Left Arm)   Pulse 80   Temp 98 F (36.7 C) (Oral)   Resp 16   Ht 5\' 7"  (1.702 m)   Wt 99.8 kg   SpO2 100%   BMI 34.46 kg/m   Physical Exam Vitals and nursing note  reviewed.  Constitutional:      General: He is not in acute distress.    Appearance: He is well-developed.  HENT:     Head: Normocephalic and atraumatic.  Eyes:     Conjunctiva/sclera: Conjunctivae normal.  Cardiovascular:     Rate and Rhythm: Normal rate and regular rhythm.     Pulses: Normal pulses.  Pulmonary:     Effort: Pulmonary effort is normal. No respiratory distress.     Breath sounds: No stridor.  Abdominal:     General: There is no distension.  Musculoskeletal:     Left elbow: Normal.     Left forearm: Normal.       Arms:  Skin:    General: Skin is warm and dry.  Neurological:     Mental Status: He is alert and oriented to person, place, and time.     ED Results / Procedures / Treatments   Labs (all labs ordered are listed, but only abnormal results are displayed) Labs Reviewed - No data to  display  EKG None  Radiology DG Wrist Complete Left  Result Date: 05/22/2020 CLINICAL DATA:  Pain EXAM: LEFT WRIST - COMPLETE 3+ VIEW COMPARISON:  None. FINDINGS: Frontal, oblique, lateral, and ulnar deviation scaphoid images were obtained. There is a transverse lucency in the distal scaphoid region. While this area may represent a prominent nutrient foramen, an incomplete fracture in this area could present in this manner. No other findings elsewhere suggesting potential fracture. No dislocation. Joint spaces appear normal. No erosive change. IMPRESSION: Transverse lucency in the distal scaphoid. While this area potentially could represent a prominent nutrient foramen, the appearance in this area raises concern for potential incomplete fracture. Clinical assessment with respect to potential fracture advised given this finding. No other findings suggesting potential fracture elsewhere. No dislocation. No evident arthropathy. Electronically Signed   By: Bretta Bang III M.D.   On: 05/22/2020 10:00    Procedures Procedures   Medications Ordered in ED Medications - No data to display  ED Course  I have reviewed the triage vital signs and the nursing notes.  Pertinent labs & imaging results that were available during my care of the patient were reviewed by me and considered in my medical decision making (see chart for details).   After initial evaluation I reviewed the patient's x-ray with him in the room, I demonstrated the scaphoid abnormality, and compared it to an x-ray from 1 year prior of his contralateral hand that did not demonstrate abnormality. With concern for scaphoid fracture, that may have occurred as long as 1 month ago, the patient will have splint applied. I discussed his case with our orthopedic surgeon to ensure appropriate ongoing outpatient care.  Absent other complaints, with no distal neurovascular compromise, patient discharged in stable condition.  Final Clinical  Impression(s) / ED Diagnoses Final diagnoses:  Closed nondisplaced fracture of middle third of scaphoid bone of left wrist, initial encounter     Gerhard Munch, MD 05/22/20 1251

## 2020-05-22 NOTE — ED Triage Notes (Signed)
Pt c/o left wrist pain x 1 month. C/o stinging and stiffness that wakes him up from sleep. Pt works at [employer] center and c/o carpel tunnel. Pt denies injury/trauma

## 2020-05-22 NOTE — Discharge Instructions (Signed)
As discussed, it is important that you follow-up with our orthopedic colleague.  Please keep your wrist splint in place, use Tylenol, ibuprofen for pain control.  Return here for concerning changes in your condition.

## 2020-05-22 NOTE — Progress Notes (Signed)
Orthopedic Tech Progress Note Patient Details:  Lee Garcia May 27, 1985 [id number]  Ortho Devices Type of Ortho Device: Thumb velcro splint Ortho Device/Splint Location: left Ortho Device/Splint Interventions: Application   Post Interventions Patient Tolerated: Well Instructions Provided: Care of device   [name] 05/22/2020, 12:51 PM

## 2020-05-22 NOTE — ED Provider Notes (Signed)
MSE was initiated and I personally evaluated the patient and placed orders (if any) at  9:26 AM on May 22, 2020.  Patient with left wrist pain x1 month.  Worse with certain movements and after doing lots of lifting at work.  Intermittent numbness, none currently.  No fall, trauma, or injury.  Initially pain was both wrist, now just the left.  He is not taking anything for it.  No history of previous wrist problems.  No fevers.  On exam, no significant swelling or deformity.  No erythema or warmth. neurointact.  X-ray obtained from triage.  The patient appears stable so that the remainder of the MSE may be completed by another provider.   Alveria Apley, PA-C 05/22/20 2620    Linwood Dibbles, MD 05/23/20 [phone]

## 2020-12-21 ENCOUNTER — Other Ambulatory Visit: Payer: Self-pay

## 2020-12-21 ENCOUNTER — Encounter (HOSPITAL_COMMUNITY): Payer: Self-pay

## 2020-12-21 ENCOUNTER — Ambulatory Visit (HOSPITAL_COMMUNITY)
Admission: EM | Admit: 2020-12-21 | Discharge: 2020-12-21 | Disposition: A | Discharge status: Home / Self Care | Payer: Managed Care, Other (non HMO)

## 2020-12-21 DIAGNOSIS — S62025A Nondisplaced fracture of middle third of navicular [scaphoid] bone of left wrist, initial encounter for closed fracture: Secondary | ICD-10-CM | POA: Diagnosis not present

## 2020-12-21 NOTE — Discharge Instructions (Addendum)
-  You probably have a small fracture of a bone in your wrist. This bone is hard to see on xray. It's very important that it heals correctly so you don't have arthritis in the future. You must follow-up with an orthopedist for this evaluation, information below. This is the same orthopedist that the ED recommended you see. Continue to use your wrist brace at all times until this visit. You must call to set up the appointment.

## 2020-12-21 NOTE — ED Provider Notes (Signed)
[facility]    CSN: [id number] Arrival date & time: 12/21/20  1258      History   Chief Complaint Chief Complaint  Patient presents with   Wrist Pain    HPI Lee Garcia is a 35 y.o. male presenting with L wrist pain x months, worse x1 month.  Describes left wrist pain for months, worse over the last month after heavy lifting at work.  States that his symptoms got a little bit better with the wrist brace, but then he lifted a 115 pound package 1 day ago and his symptoms immediately returned.  This patient actually went to the emergency department on 4/5 for his symptoms, he was advised that he probably had a scaphoid fracture and he should follow-up with an orthopedic specialist.  He did not do so, and instead proceeded to do light duty at work for months.  States that the doctor at work cleared him to return to full duty stating that his symptoms were merely tendinitis.  He has only been at full duty for 1 day and symptoms have completely returned.  Denies sensation changes. He is right handed.   HPI  Past Medical History:  Diagnosis Date   Hypertension     Patient Active Problem List   Diagnosis Date Noted   Left-sided low back pain without sciatica 10/07/2013   Essential hypertension 10/07/2013   ANXIETY 09/21/2008   PALPITATIONS 09/21/2008    Past Surgical History:  Procedure Laterality Date   BACK SURGERY         Home Medications    Prior to Admission medications   Medication Sig Start Date End Date Taking? Authorizing Provider  amLODipine (NORVASC) 5 MG tablet Take 1 tablet (5 mg total) by mouth daily. 04/10/19 05/10/19  Couture, Cortni S, PA-C  ondansetron (ZOFRAN ODT) 4 MG disintegrating tablet Take 1 tablet (4 mg total) by mouth every 8 (eight) hours as needed for nausea or vomiting. 02/14/20   Pricilla Loveless, MD  ondansetron (ZOFRAN) 4 MG tablet Take 1 tablet (4 mg total) by mouth every 6 (six) hours. 04/10/19   Couture, Cortni S, PA-C   predniSONE (DELTASONE) 20 MG tablet Take 2 tablets (40 mg total) by mouth daily with breakfast. For the next four days 06/26/19   Gerhard Munch, MD    Family History Family History  Problem Relation Age of Onset   Diabetes Mother    Hypertension Mother    Diabetes Father    Hypertension Father     Social History Social History   Tobacco Use   Smoking status: Some Days    Types: Cigars   Smokeless tobacco: Never  Vaping Use   Vaping Use: Never used  Substance Use Topics   Alcohol use: Yes   Drug use: No     Allergies   Iodine and Shellfish allergy   Review of Systems Review of Systems  Musculoskeletal:        L wrist pain  All other systems reviewed and are negative.   Physical Exam Triage Vital Signs ED Triage Vitals  Enc Vitals Group     BP 12/21/20 1413 (!) 145/95     Pulse Rate 12/21/20 1413 91     Resp 12/21/20 1413 18     Temp 12/21/20 1413 98.3 F (36.8 C)     Temp Source 12/21/20 1413 Oral     SpO2 12/21/20 1413 97 %     Weight --      Height --  Head Circumference --      Peak Flow --      Pain Score 12/21/20 1412 7     Pain Loc --      Pain Edu? --      Excl. in [place]? --    No data found.  Updated Vital Signs BP (!) 145/95 (BP Location: Left Arm)   Pulse 91   Temp 98.3 F (36.8 C) (Oral)   Resp 18   SpO2 97%   Visual Acuity Right Eye Distance:   Left Eye Distance:   Bilateral Distance:    Right Eye Near:   Left Eye Near:    Bilateral Near:     Physical Exam Vitals reviewed.  Constitutional:      General: He is not in acute distress.    Appearance: Normal appearance. He is not ill-appearing.  HENT:     Head: Normocephalic and atraumatic.  Pulmonary:     Effort: Pulmonary effort is normal.  Musculoskeletal:     Comments: L wrist- TTP distal radius, with some effusion and point tenderness. There is some tenderness in the anatomical snuffbox. Pain with flexion and extension. Grip strength 5/5, sensation intact, radial  pulse 2+, cap refill <2 seconds.   Neurological:     General: No focal deficit present.     Mental Status: He is alert and oriented to person, place, and time.  Psychiatric:        Mood and Affect: Mood normal.        Behavior: Behavior normal.        Thought Content: Thought content normal.        Judgment: Judgment normal.     UC Treatments / Results  Labs (all labs ordered are listed, but only abnormal results are displayed) Labs Reviewed - No data to display  EKG   Radiology No results found.  Procedures Procedures (including critical care time)  Medications Ordered in UC Medications - No data to display  Initial Impression / Assessment and Plan / UC Course  I have reviewed the triage vital signs and the nursing notes.  Pertinent labs & imaging results that were available during my care of the patient were reviewed by me and considered in my medical decision making (see chart for details).     This patient is a very pleasant 35 y.o. year old male presenting with L wrist pain x months, with current exacerbation. Was evaluated in the ED 05/2020 and diagnosed with scaphoid fracture, was advised to f/u with orthopedist which he did not do. Again provided him with this information. Cleared for light duty only (lifting no more than 10 pounds).   Final Clinical Impressions(s) / UC Diagnoses   Final diagnoses:  Closed nondisplaced fracture of middle third of scaphoid bone of left wrist, initial encounter     Discharge Instructions      -You probably have a small fracture of a bone in your wrist. This bone is hard to see on xray. It's very important that it heals correctly so you don't have arthritis in the future. You must follow-up with an orthopedist for this evaluation, information below. This is the same orthopedist that the ED recommended you see. Continue to use your wrist brace at all times until this visit. You must call to set up the appointment.    ED  Prescriptions   None    PDMP not reviewed this encounter.   Hazel Sams, PA-C 12/21/20 1531

## 2020-12-21 NOTE — ED Triage Notes (Signed)
Pt reports pain and swelling the left wrist since this morning when he went back to work. States he was told at the ED he had a fracture and was told later by the Doctor at his job it was tendonitis.

## 2020-12-21 NOTE — ED Notes (Signed)
Pt not answered phone call.

## 2021-06-17 ENCOUNTER — Ambulatory Visit (HOSPITAL_COMMUNITY)
Admission: EM | Admit: 2021-06-17 | Discharge: 2021-06-17 | Disposition: A | Discharge status: Home / Self Care | Payer: 59

## 2021-06-17 DIAGNOSIS — F330 Major depressive disorder, recurrent, mild: Secondary | ICD-10-CM

## 2021-06-17 NOTE — BH Assessment (Signed)
[facility] Assessment Progress Note ?  ?Per Lestine Mount, MD, this pt does not require psychiatric hospitalization at this time.  Pt is psychiatrically cleared.  Discharge instructions include referral information for several area providers of psychiatry and therapy.  Dr Kai Levins has been notified. ? ?Jalene Mullet, MA ?Triage Specialist ?[phone] ? ?

## 2021-06-17 NOTE — Discharge Instructions (Addendum)
For your behavioral health needs you are advised to follow up with outpatient psychiatry and therapy.  The providers listed below all provide both services.  Contact them at your earliest opportunity to schedule an intake appointment: ? ?     [facility] ?     [address] ?     [place], [place] [address] ?     [phone]  ? ?     [facility] ?     [address] ?     [place], [place] [address] ?     [phone] ? ?     [facility] ?     [address] ?     [place], [place] [address] ?     [phone]  ?

## 2021-06-17 NOTE — BH Assessment (Signed)
Pt reports worsening depression after his father passed away two years ago. Reports symptoms of low mood, irritable, not sleeping and feeling depressed. Denies SI, HI, AVH and substance use. Pt is interested in therapy.  ?
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
## 2021-07-01 ENCOUNTER — Telehealth (HOSPITAL_COMMUNITY): Payer: Self-pay

## 2021-07-01 NOTE — BH Assessment (Signed)
Care Management - [facility] Follow Up Discharges  ° °Writer attempted to make contact with patient today and was unsuccessful.  Writer left a HIPPA compliant voice message.  ° °Per chart review, patient was provided with outpatient resources. ° °

## 2021-11-25 ENCOUNTER — Emergency Department (HOSPITAL_COMMUNITY)
Admission: EM | Admit: 2021-11-25 | Discharge: 2021-11-25 | Disposition: A | Discharge status: Home / Self Care | Payer: Managed Care, Other (non HMO) | Attending: Emergency Medicine | Admitting: Emergency Medicine

## 2021-11-25 ENCOUNTER — Encounter (HOSPITAL_COMMUNITY): Payer: Self-pay

## 2021-11-25 ENCOUNTER — Emergency Department (HOSPITAL_COMMUNITY): Payer: Managed Care, Other (non HMO)

## 2021-11-25 DIAGNOSIS — M25512 Pain in left shoulder: Secondary | ICD-10-CM

## 2021-11-25 DIAGNOSIS — S4992XA Unspecified injury of left shoulder and upper arm, initial encounter: Secondary | ICD-10-CM | POA: Diagnosis present

## 2021-11-25 DIAGNOSIS — X500XXA Overexertion from strenuous movement or load, initial encounter: Secondary | ICD-10-CM | POA: Diagnosis not present

## 2021-11-25 DIAGNOSIS — Y990 Civilian activity done for income or pay: Secondary | ICD-10-CM | POA: Insufficient documentation

## 2021-11-25 MED ORDER — IBUPROFEN 800 MG PO TABS
800.0000 mg | ORAL_TABLET | Freq: Once | ORAL | Status: AC
Start: 2021-11-25 — End: 2021-11-25
  Administered 2021-11-25: 800 mg via ORAL
  Filled 2021-11-25: qty 1

## 2021-11-25 MED ORDER — IBUPROFEN 800 MG PO TABS: 800.0000 mg | ORAL_TABLET | Freq: Three times a day (TID) | ORAL | 0 refills | Status: DC

## 2021-11-25 MED ORDER — LIDOCAINE 5 % EX PTCH: 1.0000 | MEDICATED_PATCH | CUTANEOUS | 0 refills | Status: DC

## 2021-11-25 NOTE — Discharge Instructions (Signed)
Your work note is attached.  Use ice for any swelling in your acute injury.  Ibuprofen will do better for you than Tylenol but you may alternate both.  Ibuprofen has been sent to your pharmacy on file.  The lidocaine patches can only be worn for 12 hours at a time.  You may use up to 3 patches at the same time however be sure to have 12 hours patch free.  Return with any worsening symptoms, especially inability to feel your arm or worsening range of motion. It was a pleasure to meet you and we hope you feel better!

## 2021-11-25 NOTE — ED Triage Notes (Signed)
Patient arrived stating last night he lifted a bag of potatoes and heard a "click" states hes unable to lift it all the way up.

## 2021-11-25 NOTE — ED Provider Notes (Signed)
[facility] DEPT Provider Note   CSN: [id number] Arrival date & time: 11/25/21  2130     History  Chief Complaint  Patient presents with   Shoulder Pain    Lee Garcia is a 36 y.o. male presenting with a left shoulder injury.  Reports that yesterday at work he lifted a 50 pound bag of potatoes and felt a click in his shoulder.  He felt fine for a while but last night he felt as though he cannot fully flex his shoulder.  No numbness or tingling.  No previous injury.  Took a Tylenol which barely helped   Shoulder Pain      Home Medications Prior to Admission medications   Medication Sig Start Date End Date Taking? Authorizing Provider  amLODipine (NORVASC) 5 MG tablet Take 1 tablet (5 mg total) by mouth daily. 04/10/19 05/10/19  Couture, Cortni S, PA-C  ibuprofen (ADVIL) 800 MG tablet Take 1 tablet (800 mg total) by mouth 3 (three) times daily. 11/25/21  Yes Raynetta Osterloh A, PA-C  lidocaine (LIDODERM) 5 % Place 1 patch onto the skin daily. Remove & Discard patch within 12 hours or as directed by MD 11/25/21  Yes Judithe Keetch A, PA-C  ondansetron (ZOFRAN ODT) 4 MG disintegrating tablet Take 1 tablet (4 mg total) by mouth every 8 (eight) hours as needed for nausea or vomiting. 02/14/20   Sherwood Gambler, MD  ondansetron (ZOFRAN) 4 MG tablet Take 1 tablet (4 mg total) by mouth every 6 (six) hours. 04/10/19   Couture, Cortni S, PA-C  predniSONE (DELTASONE) 20 MG tablet Take 2 tablets (40 mg total) by mouth daily with breakfast. For the next four days 06/26/19   Carmin Muskrat, MD      Allergies    Iodine and Shellfish allergy    Review of Systems   Review of Systems  Physical Exam Updated Vital Signs BP (!) 169/114   Pulse 99   Temp 98 F (36.7 C) (Oral)   Resp 16   SpO2 100%  Physical Exam Vitals and nursing note reviewed.  Constitutional:      Appearance: Normal appearance.  HENT:     Head: Normocephalic and atraumatic.  Eyes:      General: No scleral icterus.    Conjunctiva/sclera: Conjunctivae normal.  Pulmonary:     Effort: Pulmonary effort is normal. No respiratory distress.  Musculoskeletal:     Comments: Patient able to flex and abduct the left shoulder to 90 degrees.  No problems with extension or adduction.  Strong radial pulse and sensation intact distally.  Normal finger grip strength.  Mild TTP humeral head  Skin:    Findings: No rash.  Neurological:     Mental Status: He is alert.  Psychiatric:        Mood and Affect: Mood normal.     ED Results / Procedures / Treatments   Labs (all labs ordered are listed, but only abnormal results are displayed) Labs Reviewed - No data to display  EKG None  Radiology No results found.  Procedures Procedures   Medications Ordered in ED Medications  ibuprofen (ADVIL) tablet 800 mg (has no administration in time range)    ED Course/ Medical Decision Making/ A&P                           Medical Decision Making Amount and/or Complexity of Data Reviewed Radiology: ordered.  Risk Prescription drug management.   36 year old  male presenting after lifting a heavy bag of potatoes yesterday which resulted in a click in his shoulder.  Reports decreased range of motion.  On physical exam patient limits his flexion and abduction to 90 degrees.  Passive range of motion fully intact.  Patient had a strong radial pulse and experienced no decreased sensation.  Had normal strength in the extremity.  He did not fall or sustain any other trauma to the shoulder so x-ray was not pursued at this time.  At this time I believe patient is stable for discharge home with conservative management with NSAIDs, ice and lidocaine patches.  Return precautions discussed.  Given follow-up with sports medicine as needed down the line.   Final Clinical Impression(s) / ED Diagnoses Final diagnoses:  Acute pain of left shoulder    Rx / DC Orders ED Discharge Orders           Ordered    ibuprofen (ADVIL) 800 MG tablet  3 times daily        11/25/21 0703    lidocaine (LIDODERM) 5 %  Every 24 hours        11/25/21 0703           Results and diagnoses were explained to the patient. Return precautions discussed in full. Patient had no additional questions and expressed complete understanding.   This chart was dictated using voice recognition software.  Despite best efforts to proofread,  errors can occur which can change the documentation meaning.    Rhae Hammock, PA-C 11/25/21 [place], MD 11/25/21 [phone]

## 2022-03-05 ENCOUNTER — Other Ambulatory Visit: Payer: Self-pay

## 2022-03-05 ENCOUNTER — Emergency Department (HOSPITAL_BASED_OUTPATIENT_CLINIC_OR_DEPARTMENT_OTHER)
Admission: EM | Admit: 2022-03-05 | Discharge: 2022-03-05 | Disposition: A | Discharge status: Home / Self Care | Payer: Managed Care, Other (non HMO) | Attending: Emergency Medicine | Admitting: Emergency Medicine

## 2022-03-05 ENCOUNTER — Encounter (HOSPITAL_BASED_OUTPATIENT_CLINIC_OR_DEPARTMENT_OTHER): Payer: Self-pay | Admitting: Emergency Medicine

## 2022-03-05 DIAGNOSIS — U071 COVID-19: Secondary | ICD-10-CM

## 2022-03-05 LAB — RESP PANEL BY RT-PCR (RSV, FLU A&B, COVID)  RVPGX2
Influenza A by PCR: NEGATIVE
Influenza B by PCR: NEGATIVE
Resp Syncytial Virus by PCR: NEGATIVE
SARS Coronavirus 2 by RT PCR: POSITIVE — AB

## 2022-03-05 NOTE — Discharge Instructions (Signed)
You were seen in the emergency department for your fever and cough.  You tested positive for COVID-19.  You did not require treatment with any antiviral medications can continue symptomatic treatment with Tylenol and Motrin as needed for fevers and bodyaches and both can be taken up to every 6 hours.  You can try Mucinex or raw honey as needed for your cough and you can use a humidifier or hot steam from the shower to help with your congestion as well as over-the-counter and nasal decongestant sprays.  You can follow-up with your primary doctor in the next few days to have your symptoms rechecked.  You should return to the emergency department for significantly worsening shortness of breath, severe chest pain, repetitive vomiting or if you have any other new or concerning symptoms.

## 2022-03-05 NOTE — ED Provider Notes (Signed)
[name] EMERGENCY DEPT Provider Note   CSN: [id number] Arrival date & time: 03/05/22  0730     History  Chief Complaint  Patient presents with   Fever    Lee Garcia is a 37 y.o. male.  Patient is a 37 year old male with no significant past medical history presenting to the emergency department with fever.  Patient states that he had a mild nonproductive cough yesterday and then woke up last night with fever.  He denies any congestion, runny nose or sore throat, nausea, vomiting or diarrhea.  He denies any chest pain or shortness of breath.  The history is provided by the patient.  Fever      Home Medications Prior to Admission medications   Medication Sig Start Date End Date Taking? Authorizing Provider  amLODipine (NORVASC) 5 MG tablet Take 1 tablet (5 mg total) by mouth Garcia. 04/10/19 05/10/19  Couture, Cortni S, PA-C  ibuprofen (ADVIL) 800 MG tablet Take 1 tablet (800 mg total) by mouth 3 (three) times Garcia. 11/25/21   Redwine, Madison A, PA-C  lidocaine (LIDODERM) 5 % Place 1 patch onto the skin Garcia. Remove & Discard patch within 12 hours or as directed by MD 11/25/21   Redwine, Madison A, PA-C  ondansetron (ZOFRAN ODT) 4 MG disintegrating tablet Take 1 tablet (4 mg total) by mouth every 8 (eight) hours as needed for nausea or vomiting. 02/14/20   Sherwood Gambler, MD  ondansetron (ZOFRAN) 4 MG tablet Take 1 tablet (4 mg total) by mouth every 6 (six) hours. 04/10/19   Couture, Cortni S, PA-C  predniSONE (DELTASONE) 20 MG tablet Take 2 tablets (40 mg total) by mouth Garcia with breakfast. For the next four days 06/26/19   Carmin Muskrat, MD      Allergies    Iodine and Shellfish allergy    Review of Systems   Review of Systems  Constitutional:  Positive for fever.    Physical Exam Updated Vital Signs BP (!) 155/95 (BP Location: Right Arm)   Pulse (!) 104   Temp 98.9 F (37.2 C) (Oral)   Resp 18   Ht 5\' 6"  (1.676 m)   Wt 113.4 kg   SpO2  98%   BMI 40.35 kg/m  Physical Exam Vitals and nursing note reviewed.  Constitutional:      General: He is not in acute distress.    Appearance: Normal appearance.  HENT:     Head: Normocephalic and atraumatic.     Nose: Nose normal.     Mouth/Throat:     Mouth: Mucous membranes are moist.     Pharynx: Oropharynx is clear.  Eyes:     Extraocular Movements: Extraocular movements intact.     Conjunctiva/sclera: Conjunctivae normal.  Cardiovascular:     Rate and Rhythm: Normal rate and regular rhythm.     Heart sounds: Normal heart sounds.  Pulmonary:     Effort: Pulmonary effort is normal.     Breath sounds: Normal breath sounds.  Abdominal:     General: Abdomen is flat.     Palpations: Abdomen is soft.     Tenderness: There is no abdominal tenderness.  Musculoskeletal:        General: Normal range of motion.     Cervical back: Normal range of motion and neck supple.  Skin:    General: Skin is warm and dry.  Neurological:     General: No focal deficit present.     Mental Status: He is alert and oriented to  person, place, and time.  Psychiatric:        Mood and Affect: Mood normal.        Behavior: Behavior normal.     ED Results / Procedures / Treatments   Labs (all labs ordered are listed, but only abnormal results are displayed) Labs Reviewed  RESP PANEL BY RT-PCR (RSV, FLU A&B, COVID)  [id number] - Abnormal; Notable for the following components:      Result Value   SARS Coronavirus 2 by RT PCR POSITIVE (*)    All other components within normal limits    EKG None  Radiology No results found.  Procedures Procedures    Medications Ordered in ED Medications - No data to display  ED Course/ Medical Decision Making/ A&P                             Medical Decision Making This patient presents to the ED with chief complaint(s) of fever, cough with no pertinent past medical history of which further complicates the presenting complaint. The complaint involves  an extensive differential diagnosis and also carries with it a high risk of complications and morbidity.    The differential diagnosis includes viral syndrome, patient has no focal lung sounds and is satting well on room air making pneumonia unlikely, patient's uvula is midline is no trismus is tolerating secretions and has normal phonation making RPA, PTA or Ludwig's unlikely, patient has no signs of severe dehydration on exam   Additional history obtained: Additional history obtained from N/A Records reviewed N/A  ED Course and Reassessment: Patient was evaluated in triage and had viral swab performed and tested positive for COVID.  He has no significant medical problems and does not require antiviral treatment at this time.  They recommended Tylenol and Motrin further fevers and bodyaches. I recommended primary care follow-up and were given strict return precautions.  Independent labs interpretation:  The following labs were independently interpreted: COVID positive  Independent visualization of imaging: N/A  Consultation: - Consulted or discussed management/test interpretation w/ external professional: N/A  Consideration for admission or further workup: Patient has no emergent conditions requiring admission or further work-up at this time and is stable for discharge home with primary care follow-up  Social Determinants of health: N/A          Final Clinical Impression(s) / ED Diagnoses Final diagnoses:  COVID-19    Rx / DC Orders ED Discharge Orders     None         Kemper Durie, DO 03/05/22 [phone]

## 2022-03-05 NOTE — ED Notes (Signed)
Discharge paperwork given and verbally understood. 

## 2022-03-05 NOTE — ED Triage Notes (Signed)
Pt arrives to ED with c/o fever that started last night.

## 2022-05-20 ENCOUNTER — Other Ambulatory Visit: Payer: Self-pay

## 2022-05-20 ENCOUNTER — Encounter (HOSPITAL_BASED_OUTPATIENT_CLINIC_OR_DEPARTMENT_OTHER): Payer: Self-pay

## 2022-05-20 ENCOUNTER — Emergency Department (HOSPITAL_BASED_OUTPATIENT_CLINIC_OR_DEPARTMENT_OTHER)
Admission: EM | Admit: 2022-05-20 | Discharge: 2022-05-20 | Disposition: A | Discharge status: Home / Self Care | Payer: Medicaid Other | Attending: Emergency Medicine | Admitting: Emergency Medicine

## 2022-05-20 DIAGNOSIS — Z79899 Other long term (current) drug therapy: Secondary | ICD-10-CM | POA: Diagnosis not present

## 2022-05-20 DIAGNOSIS — I10 Essential (primary) hypertension: Secondary | ICD-10-CM | POA: Insufficient documentation

## 2022-05-20 DIAGNOSIS — K0889 Other specified disorders of teeth and supporting structures: Secondary | ICD-10-CM | POA: Diagnosis present

## 2022-05-20 DIAGNOSIS — K029 Dental caries, unspecified: Secondary | ICD-10-CM | POA: Diagnosis not present

## 2022-05-20 DIAGNOSIS — F17290 Nicotine dependence, other tobacco product, uncomplicated: Secondary | ICD-10-CM | POA: Insufficient documentation

## 2022-05-20 MED ORDER — NAPROXEN 375 MG PO TABS: ORAL_TABLET | ORAL | 0 refills | Status: AC

## 2022-05-20 MED ORDER — NAPROXEN 250 MG PO TABS
500.0000 mg | ORAL_TABLET | Freq: Once | ORAL | Status: AC
  Administered 2022-05-20: 500 mg via ORAL
  Filled 2022-05-20: qty 2

## 2022-05-20 MED ORDER — AMOXICILLIN 500 MG PO CAPS
500.0000 mg | ORAL_CAPSULE | Freq: Once | ORAL | Status: AC
  Administered 2022-05-20: 500 mg via ORAL
  Filled 2022-05-20: qty 1

## 2022-05-20 MED ORDER — AMOXICILLIN 500 MG PO CAPS: 500.0000 mg | ORAL_CAPSULE | Freq: Three times a day (TID) | ORAL | 0 refills | Status: AC

## 2022-05-20 MED ORDER — HYDROCODONE-ACETAMINOPHEN 5-325 MG PO TABS: 1.0000 | ORAL_TABLET | Freq: Four times a day (QID) | ORAL | 0 refills | Status: AC | PRN

## 2022-05-20 NOTE — ED Provider Notes (Signed)
[facility] Provider Note: Georgena Spurling, MD, FACEP  CSN: [id number] MRN: [id number] ARRIVAL: 05/20/22 at [facility]   HISTORY OF PRESENT ILLNESS  05/20/22 3:00 AM Lee Garcia is a 37 y.o. male who saw an oral surgeon about 2 weeks ago regarding his right lower molars.  The surgeon is planning to remove all 3 of the right lower molars (as well as the left but in  staged procedures) but the patient has not yet proceeded with surgery as he has to save money to pay the surgeon.  He had not been having pain until he developed significant pain in his right lower third molar yesterday evening.  He rates the pain as a 7 out of 10, throbbing in nature.  He has taken ibuprofen without relief.   Past Medical History:  Diagnosis Date   Hypertension     Past Surgical History:  Procedure Laterality Date   BACK SURGERY      Family History  Problem Relation Age of Onset   Diabetes Mother    Hypertension Mother    Diabetes Father    Hypertension Father     Social History   Tobacco Use   Smoking status: Some Days    Types: Cigars   Smokeless tobacco: Never  Vaping Use   Vaping Use: Never used  Substance Use Topics   Alcohol use: Yes   Drug use: No    Prior to Admission medications   Medication Sig Start Date End Date Taking? Authorizing Provider  amoxicillin (AMOXIL) 500 MG capsule Take 1 capsule (500 mg total) by mouth 3 (three) times daily. 05/20/22  Yes Chrisann Melaragno, MD  HYDROcodone-acetaminophen (NORCO) 5-325 MG tablet Take 1 tablet by mouth every 6 (six) hours as needed for severe pain. 05/20/22  Yes Sequoia Witz, MD  naproxen (NAPROSYN) 375 MG tablet Take 1 tablet twice daily as needed for dental pain. 05/20/22  Yes Rekha Hobbins, MD  amLODipine (NORVASC) 5 MG tablet Take 1 tablet (5 mg total) by mouth daily. 04/10/19 05/10/19  Couture, Cortni S, PA-C    Allergies Iodine and Shellfish allergy   REVIEW OF SYSTEMS   Negative except as noted here or in the History of Present Illness.   PHYSICAL EXAMINATION  Initial Vital Signs Blood pressure (!) 164/105, pulse 85, temperature (!) 97.4 F (36.3 C), resp. rate 18, height 5\' 6"  (1.676 m), weight 114.8 kg, SpO2 99 %.  Examination General: Well-developed, well-nourished male in no acute distress; appearance consistent with age of record HENT: normocephalic; atraumatic; tenderness of soft tissue overlying the right lower molars; right lower molars with visible caries Eyes: Normal appearance Neck: supple; no lymphadenopathy Heart: regular rate and rhythm Lungs: clear to auscultation bilaterally Abdomen: soft; nondistended; nontender; bowel sounds present Extremities: No deformity; full range of motion Neurologic: Awake, alert and oriented; motor function intact in all extremities and symmetric; no facial droop Skin: Warm and dry Psychiatric: Normal mood and affect   RESULTS  Summary of this visit's results, reviewed and interpreted by myself:   EKG Interpretation  Date/Time:    Ventricular Rate:    PR Interval:    QRS Duration:   QT Interval:    QTC Calculation:   R Axis:     Text Interpretation:         Laboratory Studies: No results found for this or any previous visit (from the past 24 hour(s)). Imaging Studies: No results found.  ED COURSE and  MDM  Nursing notes, initial and subsequent vitals signs, including pulse oximetry, reviewed and interpreted by myself.  Vitals:   05/20/22 0154 05/20/22 0156  BP:  (!) 164/105  Pulse:  85  Resp:  18  Temp:  (!) 97.4 F (36.3 C)  SpO2:  99%  Weight: 114.8 kg   Height: 5\' 6"  (1.676 m)    Medications  amoxicillin (AMOXIL) capsule 500 mg (has no administration in time range)  naproxen (NAPROSYN) tablet 500 mg (has no administration in time range)    The patient is not currently on an antibiotic.  We will start him on an antibiotic and treat his pain and have him follow-up with his  oral surgeon.  PROCEDURES  Procedures   ED DIAGNOSES     ICD-10-CM   1. Pain due to dental caries  K02.9          Auden Wettstein, Jenny Reichmann, MD 05/20/22 [phone]

## 2022-05-20 NOTE — ED Triage Notes (Signed)
POV from home, A&O x 4, GCS 15, amb to triage  Pt sts 2 weeks ago he seen dentist and needs to get tooth taken out, was doing okay until last night and he sts right side of mouth is throbbing so badly that it woke him from sleep.

## 2022-06-06 IMAGING — CR DG WRIST COMPLETE 3+V*L*
4 series · 4 of 4 positions shown · non-contrast
Comparison: None.

CLINICAL DATA: Pain

EXAM:
LEFT WRIST - COMPLETE 3+ VIEW

[x wrist pa left]
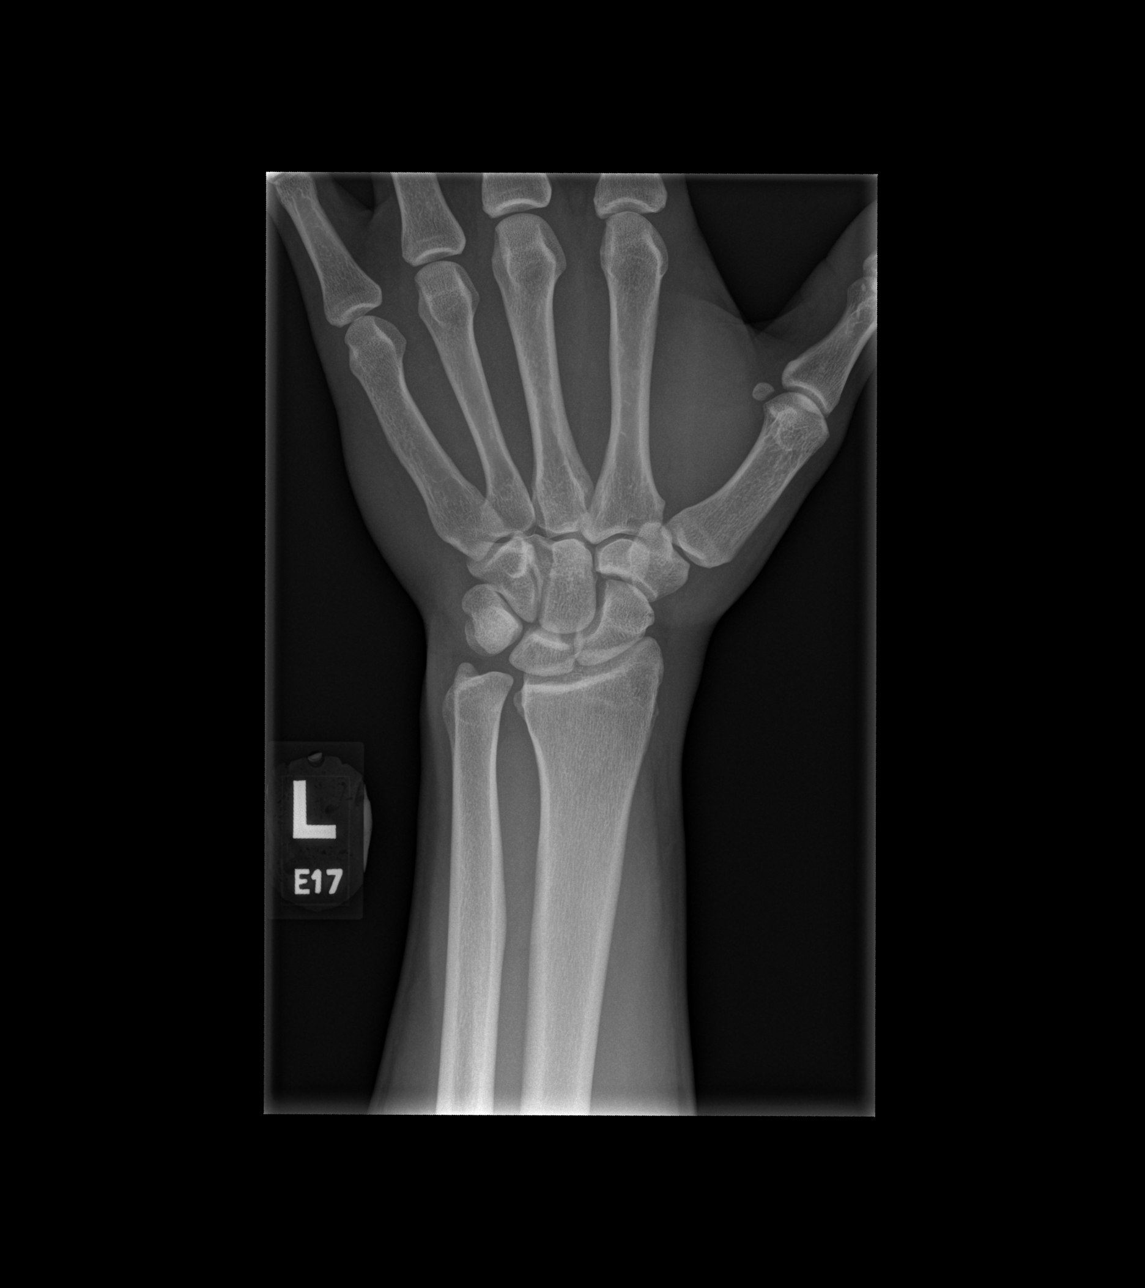

[x wrist obl left]
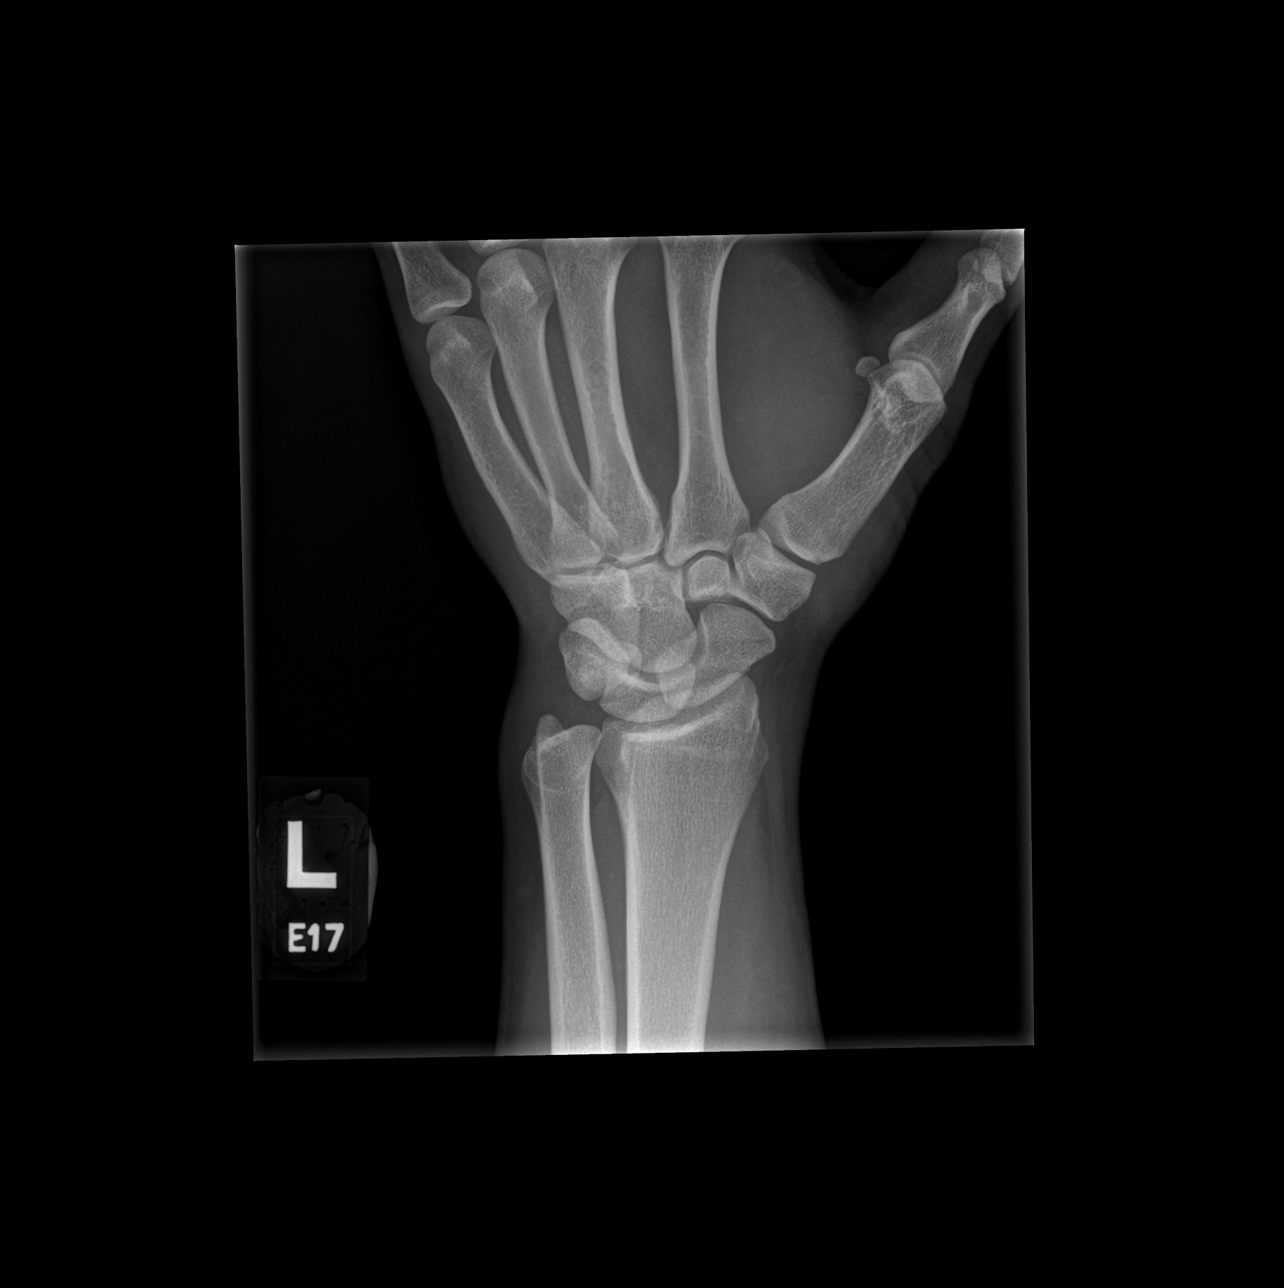

[x wrist lat left]
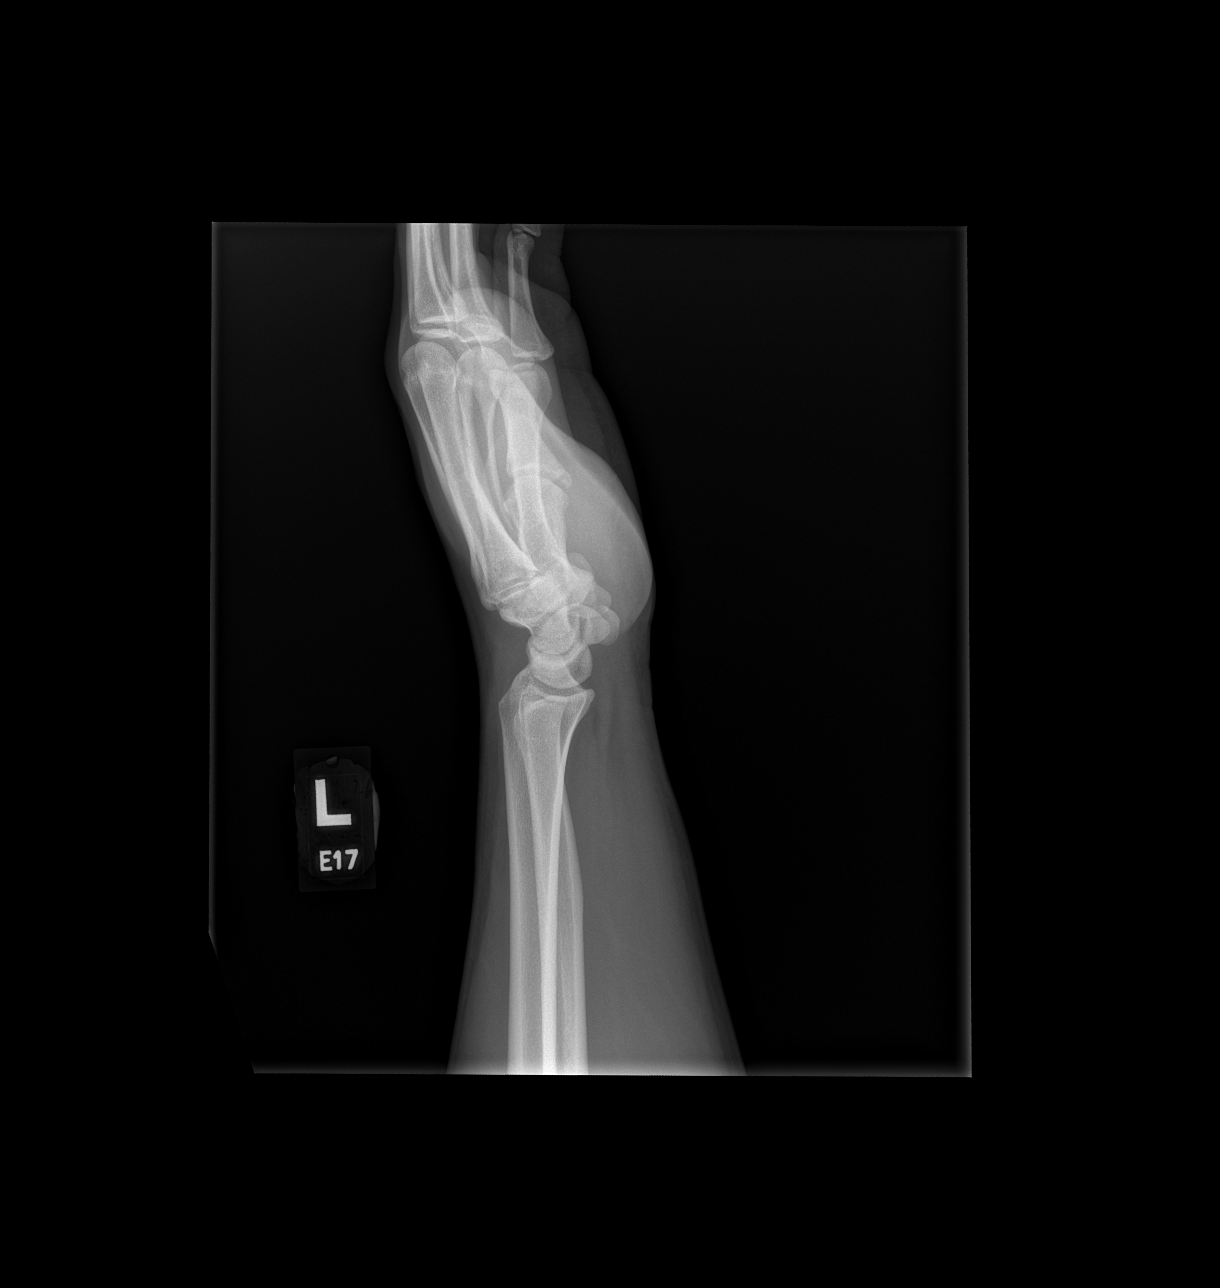

[x wrist navicular view left]
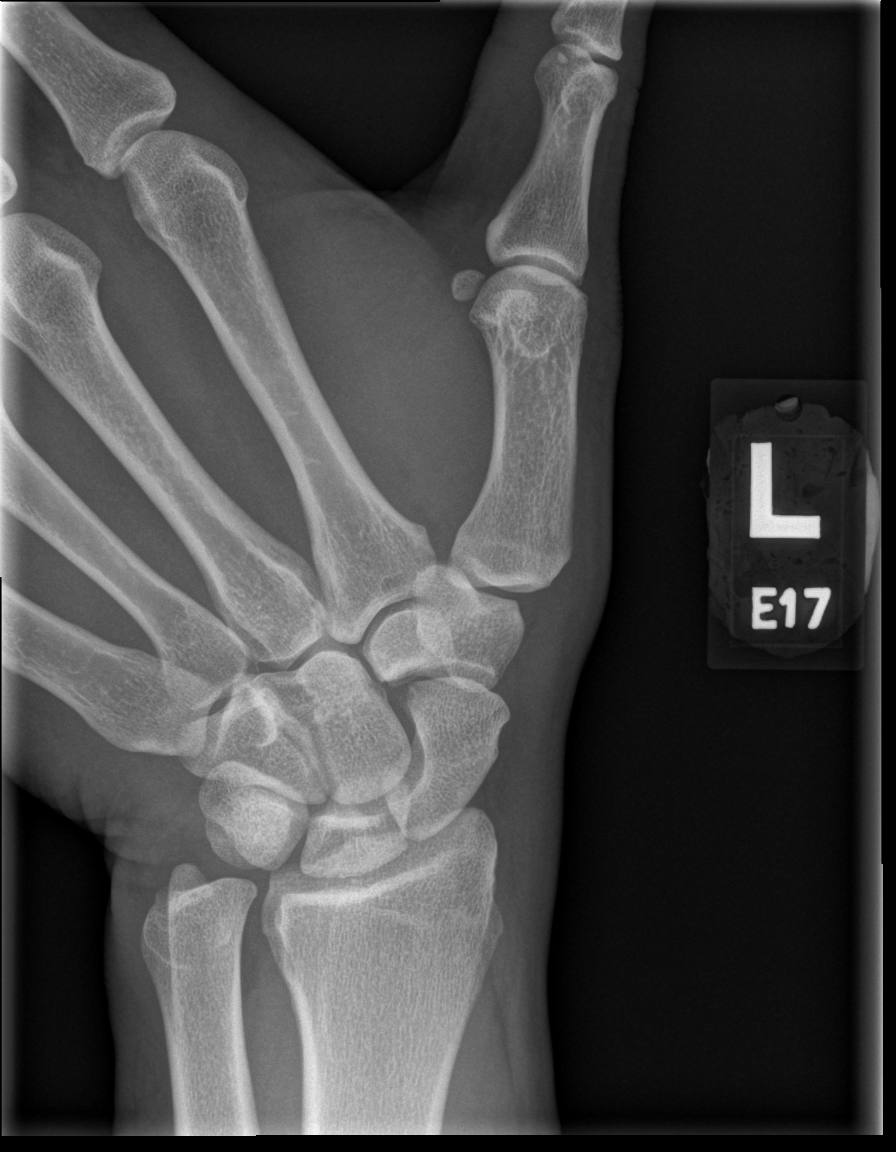

[4 of 4 positions shown; findings below may reference images not displayed]

FINDINGS: Frontal, oblique, lateral, and ulnar deviation scaphoid images were
obtained. There is a transverse lucency in the distal scaphoid
region. While this area may represent a prominent nutrient foramen,
an incomplete fracture in this area could present in this manner. No
other findings elsewhere suggesting potential fracture. No
dislocation. Joint spaces appear normal. No erosive change.
IMPRESSION: Transverse lucency in the distal scaphoid. While this area
potentially could represent a prominent nutrient foramen, the
appearance in this area raises concern for potential incomplete
fracture. Clinical assessment with respect to potential fracture
advised given this finding.

No other findings suggesting potential fracture elsewhere. No
dislocation. No evident arthropathy.

## 2022-07-28 ENCOUNTER — Encounter (HOSPITAL_BASED_OUTPATIENT_CLINIC_OR_DEPARTMENT_OTHER): Payer: Self-pay | Admitting: Emergency Medicine

## 2022-07-28 ENCOUNTER — Emergency Department (HOSPITAL_BASED_OUTPATIENT_CLINIC_OR_DEPARTMENT_OTHER)
Admission: EM | Admit: 2022-07-28 | Discharge: 2022-07-28 | Disposition: A | Discharge status: Home / Self Care | Payer: Medicaid Other | Attending: Emergency Medicine | Admitting: Emergency Medicine

## 2022-07-28 ENCOUNTER — Other Ambulatory Visit: Payer: Self-pay

## 2022-07-28 DIAGNOSIS — M545 Low back pain, unspecified: Secondary | ICD-10-CM | POA: Insufficient documentation

## 2022-07-28 MED ORDER — KETOROLAC TROMETHAMINE 15 MG/ML IJ SOLN
15.0000 mg | Freq: Once | INTRAMUSCULAR | Status: AC
  Administered 2022-07-28: 15 mg via INTRAMUSCULAR
  Filled 2022-07-28: qty 1

## 2022-07-28 MED ORDER — METHOCARBAMOL 500 MG PO TABS: 500.0000 mg | ORAL_TABLET | Freq: Two times a day (BID) | ORAL | 0 refills | Status: AC

## 2022-07-28 NOTE — ED Triage Notes (Signed)
Pt arrives to ED with c/o lower back pain.

## 2022-07-28 NOTE — Discharge Instructions (Signed)
Your back pain is most likely due to a muscular strain.  There is been a lot of research on back pain, unfortunately the only thing that seems to really help is Tylenol and ibuprofen.  Relative rest is also important to not lift greater than 10 pounds bending or twisting at the waist.  Please follow-up with your family physician.  The other thing that really seems to benefit patients is physical therapy which your doctor may send you for.  Please return to the emergency department for new numbness or weakness to your arms or legs. Difficulty with urinating or urinating or pooping on yourself.  Also if you cannot feel toilet paper when you wipe or get a fever.   Take 4 over the counter ibuprofen tablets 3 times a day or 2 over-the-counter naproxen tablets twice a day for pain. Also take tylenol 1000mg (2 extra strength) four times a day.   Stretches can also work. [email or web address]

## 2022-07-28 NOTE — ED Provider Notes (Signed)
[facility] EMERGENCY DEPARTMENT AT [facility] Provider Note   CSN: [id number] Arrival date & time: 07/28/22  4098     History  Chief Complaint  Patient presents with   Back Pain    Lee Garcia is a 37 y.o. male.  37 yo M with a chief complaint of low back pain.  This has bothered him for years.  He denies any acute trauma to the area.  Denies loss of bowel or bladder denies loss perirectal sensation.  Has pain daily but feels like this got worse this morning he woke up and had trouble bending over to tie his shoes due to discomfort.  Denies abdominal pain.  Worse with twisting and turning palpation.   Back Pain      Home Medications Prior to Admission medications   Medication Sig Start Date End Date Taking? Authorizing Provider  methocarbamol (ROBAXIN) 500 MG tablet Take 1 tablet (500 mg total) by mouth 2 (two) times daily. 07/28/22  Yes Melene Plan, DO  amLODipine (NORVASC) 5 MG tablet Take 1 tablet (5 mg total) by mouth daily. 04/10/19 05/10/19  Couture, Cortni S, PA-C  amoxicillin (AMOXIL) 500 MG capsule Take 1 capsule (500 mg total) by mouth 3 (three) times daily. 05/20/22   Molpus, John, MD  HYDROcodone-acetaminophen (NORCO) 5-325 MG tablet Take 1 tablet by mouth every 6 (six) hours as needed for severe pain. 05/20/22   Molpus, John, MD  naproxen (NAPROSYN) 375 MG tablet Take 1 tablet twice daily as needed for dental pain. 05/20/22   Molpus, Jonny Ruiz, MD      Allergies    Iodine and Shellfish allergy    Review of Systems   Review of Systems  Musculoskeletal:  Positive for back pain.    Physical Exam Updated Vital Signs BP (!) 169/108 (BP Location: Right Arm)   Pulse 91   Temp 97.8 F (36.6 C) (Temporal)   Resp 18   Ht 5\' 6"  (1.676 m)   Wt 113.4 kg   SpO2 100%   BMI 40.35 kg/m  Physical Exam Vitals and nursing note reviewed.  Constitutional:      Appearance: He is well-developed.  HENT:     Head: Normocephalic and atraumatic.  Eyes:     Pupils:  Pupils are equal, round, and reactive to light.  Neck:     Vascular: No JVD.  Cardiovascular:     Rate and Rhythm: Normal rate and regular rhythm.     Heart sounds: No murmur heard.    No friction rub. No gallop.  Pulmonary:     Effort: No respiratory distress.     Breath sounds: No wheezing.  Abdominal:     General: There is no distension.     Tenderness: There is no abdominal tenderness. There is no guarding or rebound.  Musculoskeletal:        General: Normal range of motion.     Cervical back: Normal range of motion and neck supple.     Comments: No obvious midline spinal tenderness step-offs or deformities.  Points to the bottom of L-spine as area of discomfort.  No pain to the piriformis.  Pulse motor and sensation intact bilateral lower extremities.  Reflexes are 2+ and equal.  No clonus.  Skin:    Coloration: Skin is not pale.     Findings: No rash.  Neurological:     Mental Status: He is alert and oriented to person, place, and time.  Psychiatric:        Behavior:  Behavior normal.     ED Results / Procedures / Treatments   Labs (all labs ordered are listed, but only abnormal results are displayed) Labs Reviewed - No data to display  EKG None  Radiology No results found.  Procedures Procedures    Medications Ordered in ED Medications  ketorolac (TORADOL) 15 MG/ML injection 15 mg (15 mg Intramuscular Given 07/28/22 0754)    ED Course/ Medical Decision Making/ A&P                             Medical Decision Making Risk Prescription drug management.   37 yo M with a chief complaint of low back pain.  This has been an ongoing issue for him.  Feels like it gotten worse overnight.  He denies any recent trauma.  No red flags.  Will treat his pain conservatively.  He has follow-up this week with his PCP.   7:58 AM:  I have discussed the diagnosis/risks/treatment options with the patient.  Evaluation and diagnostic testing in the emergency department does not  suggest an emergent condition requiring admission or immediate intervention beyond what has been performed at this time.  They will follow up with PCP. We also discussed returning to the ED immediately if new or worsening sx occur. We discussed the sx which are most concerning (e.g., sudden worsening pain, fever, inability to tolerate by mouth, cauda equina s/sx) that necessitate immediate return. Medications administered to the patient during their visit and any new prescriptions provided to the patient are listed below.  Medications given during this visit Medications  ketorolac (TORADOL) 15 MG/ML injection 15 mg (15 mg Intramuscular Given 07/28/22 0754)     The patient appears reasonably screen and/or stabilized for discharge and I doubt any other medical condition or other [facility] requiring further screening, evaluation, or treatment in the ED at this time prior to discharge.          Final Clinical Impression(s) / ED Diagnoses Final diagnoses:  Acute midline low back pain without sciatica    Rx / DC Orders ED Discharge Orders          Ordered    methocarbamol (ROBAXIN) 500 MG tablet  2 times daily        07/28/22 0753              Melene Plan, DO 07/28/22 [phone]

## 2022-12-04 ENCOUNTER — Ambulatory Visit: Payer: Medicaid Other | Attending: Internal Medicine

## 2022-12-04 ENCOUNTER — Other Ambulatory Visit: Payer: Self-pay

## 2022-12-04 DIAGNOSIS — M6281 Muscle weakness (generalized): Secondary | ICD-10-CM | POA: Diagnosis present

## 2022-12-04 DIAGNOSIS — M5459 Other low back pain: Secondary | ICD-10-CM | POA: Insufficient documentation

## 2022-12-04 DIAGNOSIS — R2689 Other abnormalities of gait and mobility: Secondary | ICD-10-CM | POA: Diagnosis present

## 2022-12-04 NOTE — Therapy (Addendum)
OUTPATIENT PHYSICAL THERAPY THORACOLUMBAR EVALUATION   Patient Name: Lee Garcia MRN: [id number] DOB:May 21, 1985, 37 y.o., male Today's Date: 12/04/2022  END OF SESSION:  PT End of Session - 12/04/22 1434     Visit Number 1    Number of Visits 17    Date for PT Re-Evaluation 01/29/23    Authorization Type [employer]    PT Start Time 1317    PT Stop Time 1357    PT Time Calculation (min) 40 min    Activity Tolerance Patient tolerated treatment well    Behavior During Therapy [facility] for tasks assessed/performed             Past Medical History:  Diagnosis Date   Hypertension    Past Surgical History:  Procedure Laterality Date   BACK SURGERY     Patient Active Problem List   Diagnosis Date Noted   Left-sided low back pain without sciatica 10/07/2013   Essential hypertension 10/07/2013   ANXIETY 09/21/2008   PALPITATIONS 09/21/2008    PCP: Vella Kohler, FNP  REFERRING PROVIDER: Newton Pigg, NP  REFERRING DIAG: acute midline thoracic back pain   Rationale for Evaluation and Treatment: Rehabilitation  THERAPY DIAG:  Other low back pain - Plan: PT plan of care cert/re-cert  Muscle weakness (generalized) - Plan: PT plan of care cert/re-cert  Other abnormalities of gait and mobility - Plan: PT plan of care cert/re-cert  ONSET DATE: Chronic  SUBJECTIVE:                                                                                                                                                                                           SUBJECTIVE STATEMENT: Pt presents to PT with reports of chronic LBP and recent referral of N/T and cramping into R anterior thigh. Denies bowel/bladder changes or saddle anesthesia. Recent R anterior thigh paresthesias also include severe cramping sensations. He is frustrated by his inability to get comfortable at night with sleeping as well.   PERTINENT HISTORY:  HTN  PAIN:  Are you having pain?  Yes:  NPRS scale: 3/10 Worst: 6/10 Pain location: lower back, R anterior thigh Pain description: sharp, tight, N/T Aggravating factors: work related  Relieving factors: medication  PRECAUTIONS: None  RED FLAGS: None   WEIGHT BEARING RESTRICTIONS: No  FALLS:  Has patient fallen in last 6 months? Yes. Number of falls - one mechanical fall this morning  LIVING ENVIRONMENT: Lives with: lives with their family Lives in: House/apartment  OCCUPATION: Media planner   PLOF: Independent  PATIENT GOALS: decrease back pain, improve comfort with work and sleeping  OBJECTIVE:  Note: Objective measures  were completed at Evaluation unless otherwise noted.  DIAGNOSTIC FINDINGS:  See imaging   PATIENT SURVEYS:  FOTO: 56% function; 63% predicted   COGNITION: Overall cognitive status: Within functional limits for tasks assessed     SENSATION: Light touch: Impaired - R LE  MUSCLE LENGTH: Hamstrings: Right 35 deg; Left 30 deg Thomas test: Right (+); Left (-)  POSTURE: rounded shoulders and increased lumbar lordosis  PALPATION: TTP to bilateral lumbar paraspinals  LUMBAR ROM:   AROM eval  Flexion WFL  Extension 25% reduced with pain  Right lateral flexion   Left lateral flexion   Right rotation   Left rotation    (Blank rows = not tested)  LOWER EXTREMITY MMT:    MMT Right eval Left eval  Hip flexion 3+/5 4/5  Hip extension    Hip abduction 3+/5 4/5  Hip adduction    Hip internal rotation    Hip external rotation    Knee flexion 5/5 4/5  Knee extension 5/5 4/5  Ankle dorsiflexion    Ankle plantarflexion    Ankle inversion    Ankle eversion     (Blank rows = not tested)  LUMBAR SPECIAL TESTS:  Straight leg raise test: Negative and Slump test: Positive  FUNCTIONAL TESTS:  30 Second Sit Stand: 6 reps  90/90 hold: 12 seconds  GAIT: Distance walked: 23ft Assistive device utilized: None Level of assistance: Complete Independence Comments: antalgic gait  towards R   TREATMENT: [facility] Adult PT Treatment:                                                DATE: 12/04/2022 Therapeutic Exercise: Seated sciatic nerve glide x 5 R Modified thomas stretch x 60" R Supine PPT x 5 - 5" hold S/L clamshell x 10 RTB Supine SLR x 5 each  PATIENT EDUCATION:  Education details: eval findings, FOTO, HEP, POC Person educated: Patient Education method: Explanation, Demonstration, and Handouts Education comprehension: verbalized understanding and returned demonstration  HOME EXERCISE PROGRAM: Access Code: [email or web address] URL: [email or web address] Date: 12/04/2022 Prepared by: Edwinna Areola  Exercises - Seated Sciatic Tensioner  - 1 x daily - 7 x weekly - 2 sets - 10 reps - Modified Thomas Stretch  - 1 x daily - 7 x weekly - 2 reps - 60" hold - Supine Posterior Pelvic Tilt  - 1 x daily - 7 x weekly - 2 sets - 10 reps - 5 sec hold - Clamshell with Resistance  - 1 x daily - 7 x weekly - 3 sets - 10 reps - red band hold - Active Straight Leg Raise with Quad Set  - 1 x daily - 7 x weekly - 2 sets - 10 reps  ASSESSMENT:  CLINICAL IMPRESSION: Patient is a 37 y.o. M who was seen today for physical therapy evaluation and treatment for chronic LBP with referral into R LE. Physical findings are consistent with referring provider impression as pt demonstrates decrease in core/hip strength and functional mobility. FOTO score shows decreased subjective functional ability below PLOF. Pt would benefit from skilled PT services working on improving strength in core/hip and reducing neural tension.    OBJECTIVE IMPAIRMENTS: Abnormal gait, decreased activity tolerance, decreased endurance, decreased mobility, difficulty walking, decreased ROM, decreased strength, and pain   ACTIVITY LIMITATIONS: carrying, lifting, sitting, standing, squatting, sleeping, stairs, transfers, and locomotion level  PARTICIPATION  LIMITATIONS: driving, shopping, community activity,  occupation, and yard work  PERSONAL FACTORS: Time since onset of injury/illness/exacerbation and 1 comorbidity: HTN  are also affecting patient's functional outcome.   REHAB POTENTIAL: Good  CLINICAL DECISION MAKING: Stable/uncomplicated  EVALUATION COMPLEXITY: Low   GOALS: Goals reviewed with patient? No  SHORT TERM GOALS: Target date: 12/25/2022   Pt will be compliant and knowledgeable with initial HEP for improved comfort and carryover Baseline: initial HEP given  Goal status: INITIAL  2.  Pt will self report lower back pain no greater than 4/10 for improved comfort and functional ability Baseline: 6/10 at worst Goal status: INITIAL   LONG TERM GOALS: Target date: 01/29/2023   Pt will improve FOTO function score to no less than 63% as proxy for functional improvement with home ADLs, work, and community activites Baseline: 56% function Goal status: INITIAL   2.  Pt will self report lower back pain no greater than 1/10 for improved comfort and functional ability Baseline: 6/10 at worst Goal status: INITIAL   3.  Pt will increase 30 Second Sit to Stand rep count to no less than 10 reps for improved balance, strength, and functional mobility Baseline: 6 reps  Goal status: INITIAL   4.  Pt will improve all LE MMT to no less than 5/5 for improved comfort and functional mobility while decreasing back pain Baseline: see MMT chart Goal status: INITIAL  5.  Pt will increase hold time in 90/90 test to no less than 30 seconds for demonstration of increased core strength and reduction in lower back pain Baseline: 12 seconds Goal status: INITIAL   PLAN:  PT FREQUENCY: 2x/week  PT DURATION: 8 weeks  PLANNED INTERVENTIONS: 97164- PT Re-evaluation, 97110-Therapeutic exercises, 97530- Therapeutic activity, 97112- Neuromuscular re-education, 97535- Self Care, [id number]- Manual therapy, [id number]- Gait training, [id number]- Aquatic Therapy, 97014- Electrical stimulation (unattended), [id number]-  Electrical stimulation (manual), 97016- Vasopneumatic device, Dry Needling, Cryotherapy, and Moist heat.  PLAN FOR NEXT SESSION: assess HEP response, core/hip strength, hip flexor stretching  [employer] Authorization   Choose one: Rehabilitative  Standardized Assessment or Functional Outcome Tool: FOTO   Score or Percent Disability: 56% function; 63% predicted   Body Parts Treated (Select each separately):  Lumbopelvic. Overall deficits/functional limitations for body part selected: moderate N/A. Overall deficits/functional limitations for body part selected: N/A N/A. Overall deficits/functional limitations for body part selected: N/A   If treatment provided at initial evaluation, no treatment charged due to lack of authorization.    Eloy End, PT 12/04/2022, 2:50 PM

## 2022-12-24 ENCOUNTER — Ambulatory Visit: Payer: Medicaid Other | Attending: Internal Medicine

## 2022-12-24 DIAGNOSIS — M6281 Muscle weakness (generalized): Secondary | ICD-10-CM | POA: Insufficient documentation

## 2022-12-24 DIAGNOSIS — M5459 Other low back pain: Secondary | ICD-10-CM | POA: Insufficient documentation

## 2022-12-24 DIAGNOSIS — R2689 Other abnormalities of gait and mobility: Secondary | ICD-10-CM | POA: Insufficient documentation

## 2022-12-24 NOTE — Therapy (Signed)
OUTPATIENT PHYSICAL THERAPY TREATMENT   Patient Name: Lee Garcia MRN: [id number] DOB:11-Sep-1985, 37 y.o., male Today's Date: 12/24/2022  END OF SESSION:  PT End of Session - 12/24/22 1456     Visit Number 2    Number of Visits 17    Date for PT Re-Evaluation 01/29/23    Authorization Type [employer]    PT Start Time 1530    PT Stop Time 1610    PT Time Calculation (min) 40 min    Activity Tolerance Patient tolerated treatment well    Behavior During Therapy [facility] for tasks assessed/performed              Past Medical History:  Diagnosis Date   Hypertension    Past Surgical History:  Procedure Laterality Date   BACK SURGERY     Patient Active Problem List   Diagnosis Date Noted   Left-sided low back pain without sciatica 10/07/2013   Essential hypertension 10/07/2013   ANXIETY 09/21/2008   PALPITATIONS 09/21/2008    PCP: Vella Kohler, FNP  REFERRING PROVIDER: Newton Pigg, NP  REFERRING DIAG: acute midline thoracic back pain   Rationale for Evaluation and Treatment: Rehabilitation  THERAPY DIAG:  Other low back pain  Muscle weakness (generalized)  Other abnormalities of gait and mobility  ONSET DATE: Chronic  SUBJECTIVE:                                                                                                                                                                                           SUBJECTIVE STATEMENT: Pt presents to PT with continued lower back and R LE symptoms. Has been compliant with HEP but notes pain with thomas stretch.  PERTINENT HISTORY:  HTN  PAIN:  Are you having pain?  Yes: NPRS scale: 3/10 Worst: 6/10 Pain location: lower back, R anterior thigh Pain description: sharp, tight, N/T Aggravating factors: work related  Relieving factors: medication  PRECAUTIONS: None  RED FLAGS: None   WEIGHT BEARING RESTRICTIONS: No  FALLS:  Has patient fallen in last 6 months? Yes. Number of  falls - one mechanical fall this morning  LIVING ENVIRONMENT: Lives with: lives with their family Lives in: House/apartment  OCCUPATION: Media planner   PLOF: Independent  PATIENT GOALS: decrease back pain, improve comfort with work and sleeping  OBJECTIVE:  Note: Objective measures were completed at Evaluation unless otherwise noted.  DIAGNOSTIC FINDINGS:  See imaging   PATIENT SURVEYS:  FOTO: 56% function; 63% predicted   COGNITION: Overall cognitive status: Within functional limits for tasks assessed     SENSATION: Light touch: Impaired - R LE  MUSCLE LENGTH: Hamstrings:  Right 35 deg; Left 30 deg [name] test: Right (+); Left (-)  POSTURE: rounded shoulders and increased lumbar lordosis  PALPATION: TTP to bilateral lumbar paraspinals  LUMBAR ROM:   AROM eval  Flexion WFL  Extension 25% reduced with pain  Right lateral flexion   Left lateral flexion   Right rotation   Left rotation    (Blank rows = not tested)  LOWER EXTREMITY MMT:    MMT Right eval Left eval  Hip flexion 3+/5 4/5  Hip extension    Hip abduction 3+/5 4/5  Hip adduction    Hip internal rotation    Hip external rotation    Knee flexion 5/5 4/5  Knee extension 5/5 4/5  Ankle dorsiflexion    Ankle plantarflexion    Ankle inversion    Ankle eversion     (Blank rows = not tested)  LUMBAR SPECIAL TESTS:  Straight leg raise test: Negative and Slump test: Positive  FUNCTIONAL TESTS:  30 Second Sit Stand: 6 reps  90/90 hold: 12 seconds  GAIT: Distance walked: 23ft Assistive device utilized: None Level of assistance: Complete Independence Comments: antalgic gait towards R   TREATMENT: [facility] Adult PT Treatment:                                                DATE: 12/24/2022 Therapeutic Exercise: NuStep lvl 7 UE/LE x 3 min while taking subjective Modified thomas stretch x 60" R Supine sciatic nerve glide 2x15 each Supine PPT x 10 - 5" hold Supine PPT with ball 2x10 Supine  pilates SLR 2x10  S/L hip abd 2x10 Supine 90/90 hold 2x30" S/L clamshell x 15 GTB Bridge 2x10  [facility] Adult PT Treatment:                                                DATE: 12/04/2022 Therapeutic Exercise: Seated sciatic nerve glide x 5 R Modified thomas stretch x 60" R Supine PPT x 5 - 5" hold S/L clamshell x 10 RTB Supine SLR x 5 each  PATIENT EDUCATION:  Education details: continue HEP Person educated: Patient Education method: Explanation, Demonstration, and Handouts Education comprehension: verbalized understanding and returned demonstration  HOME EXERCISE PROGRAM: Access Code: [email or web address] URL: [email or web address] Date: 12/24/2022 Prepared by: Edwinna Areola  Exercises - Seated Sciatic Tensioner  - 1 x daily - 7 x weekly - 2 sets - 10 reps - Modified Thomas Stretch  - 1 x daily - 7 x weekly - 2 reps - 60" hold - Supine Posterior Pelvic Tilt  - 1 x daily - 7 x weekly - 2 sets - 10 reps - 5 sec hold - Clamshell with Resistance  - 1 x daily - 7 x weekly - 3 sets - 10 reps - red band hold - Active Straight Leg Raise with Quad Set  - 1 x daily - 7 x weekly - 2 sets - 10 reps - Supine 90/90 Abdominal Bracing  - 1 x daily - 7 x weekly - 2 reps - 30 sec hold  ASSESSMENT:  CLINICAL IMPRESSION: Pt was able to complete all prescribed exercises with no adverse effect. Therapy focused on core/proximal hip strength for decreasing pain and improving mobility. HEP updated for continued core  strengthening. Will continue to progress as able per POC.   EVAL: Patient is a 37 y.o. M who was seen today for physical therapy evaluation and treatment for chronic LBP with referral into R LE. Physical findings are consistent with referring provider impression as pt demonstrates decrease in core/hip strength and functional mobility. FOTO score shows decreased subjective functional ability below PLOF. Pt would benefit from skilled PT services working on improving strength in core/hip and  reducing neural tension.    OBJECTIVE IMPAIRMENTS: Abnormal gait, decreased activity tolerance, decreased endurance, decreased mobility, difficulty walking, decreased ROM, decreased strength, and pain   ACTIVITY LIMITATIONS: carrying, lifting, sitting, standing, squatting, sleeping, stairs, transfers, and locomotion level  PARTICIPATION LIMITATIONS: driving, shopping, community activity, occupation, and yard work  PERSONAL FACTORS: Time since onset of injury/illness/exacerbation and 1 comorbidity: HTN  are also affecting patient's functional outcome.   REHAB POTENTIAL: Good  CLINICAL DECISION MAKING: Stable/uncomplicated  EVALUATION COMPLEXITY: Low   GOALS: Goals reviewed with patient? No  SHORT TERM GOALS: Target date: 12/25/2022   Pt will be compliant and knowledgeable with initial HEP for improved comfort and carryover Baseline: initial HEP given  Goal status: INITIAL  2.  Pt will self report lower back pain no greater than 4/10 for improved comfort and functional ability Baseline: 6/10 at worst Goal status: INITIAL   LONG TERM GOALS: Target date: 01/29/2023   Pt will improve FOTO function score to no less than 63% as proxy for functional improvement with home ADLs, work, and community activites Baseline: 56% function Goal status: INITIAL   2.  Pt will self report lower back pain no greater than 1/10 for improved comfort and functional ability Baseline: 6/10 at worst Goal status: INITIAL   3.  Pt will increase 30 Second Sit to Stand rep count to no less than 10 reps for improved balance, strength, and functional mobility Baseline: 6 reps  Goal status: INITIAL   4.  Pt will improve all LE MMT to no less than 5/5 for improved comfort and functional mobility while decreasing back pain Baseline: see MMT chart Goal status: INITIAL  5.  Pt will increase hold time in 90/90 test to no less than 30 seconds for demonstration of increased core strength and reduction in lower  back pain Baseline: 12 seconds Goal status: INITIAL   PLAN:  PT FREQUENCY: 2x/week  PT DURATION: 8 weeks  PLANNED INTERVENTIONS: 97164- PT Re-evaluation, 97110-Therapeutic exercises, 97530- Therapeutic activity, 97112- Neuromuscular re-education, 97535- Self Care, 24401- Manual therapy, [id number]- Gait training, [id number]- Aquatic Therapy, 97014- Electrical stimulation (unattended), [id number]- Electrical stimulation (manual), 97016- Vasopneumatic device, Dry Needling, Cryotherapy, and Moist heat.  PLAN FOR NEXT SESSION: assess HEP response, core/hip strength, hip flexor stretching  [employer] Authorization   Choose one: Rehabilitative  Standardized Assessment or Functional Outcome Tool: FOTO   Score or Percent Disability: 56% function; 63% predicted   Body Parts Treated (Select each separately):  Lumbopelvic. Overall deficits/functional limitations for body part selected: moderate N/A. Overall deficits/functional limitations for body part selected: N/A N/A. Overall deficits/functional limitations for body part selected: N/A   If treatment provided at initial evaluation, no treatment charged due to lack of authorization.    Eloy End, PT 12/24/2022, 4:17 PM

## 2022-12-29 ENCOUNTER — Ambulatory Visit: Payer: Medicaid Other

## 2022-12-29 DIAGNOSIS — M6281 Muscle weakness (generalized): Secondary | ICD-10-CM

## 2022-12-29 DIAGNOSIS — M5459 Other low back pain: Secondary | ICD-10-CM | POA: Diagnosis not present

## 2022-12-29 DIAGNOSIS — R2689 Other abnormalities of gait and mobility: Secondary | ICD-10-CM

## 2022-12-29 NOTE — Therapy (Signed)
OUTPATIENT PHYSICAL THERAPY TREATMENT   Patient Name: Lee Garcia MRN: [id number] DOB:[birth year], 37 y.o., male Today's Date: 12/29/2022  END OF SESSION:  PT End of Session - 12/29/22 1530     Visit Number 3    Number of Visits 17    Date for PT Re-Evaluation 01/29/23    Authorization Type [employer]    PT Start Time 1530    PT Stop Time 1615    PT Time Calculation (min) 45 min    Activity Tolerance Patient tolerated treatment well    Behavior During Therapy [facility] for tasks assessed/performed               Past Medical History:  Diagnosis Date   Hypertension    Past Surgical History:  Procedure Laterality Date   BACK SURGERY     Patient Active Problem List   Diagnosis Date Noted   Left-sided low back pain without sciatica 10/07/2013   Essential hypertension 10/07/2013   ANXIETY 09/21/2008   PALPITATIONS 09/21/2008    PCP: Vella Kohler, FNP  REFERRING PROVIDER: Newton Pigg, NP  REFERRING DIAG: acute midline thoracic back pain   Rationale for Evaluation and Treatment: Rehabilitation  THERAPY DIAG:  Other low back pain  Muscle weakness (generalized)  Other abnormalities of gait and mobility  ONSET DATE: Chronic  SUBJECTIVE:                                                                                                                                                                                           SUBJECTIVE STATEMENT: Pt presents to PT with continued lower back and R LE symptoms. Has been compliant with HEP with no adverse effect.   PERTINENT HISTORY:  HTN  PAIN:  Are you having pain?  Yes: NPRS scale: 3/10 Worst: 6/10 Pain location: lower back, R anterior thigh Pain description: sharp, tight, N/T Aggravating factors: work related  Relieving factors: medication  PRECAUTIONS: None  RED FLAGS: None   WEIGHT BEARING RESTRICTIONS: No  FALLS:  Has patient fallen in last 6 months? Yes. Number of falls - one  mechanical fall this morning  LIVING ENVIRONMENT: Lives with: lives with their family Lives in: House/apartment  OCCUPATION: Media planner   PLOF: Independent  PATIENT GOALS: decrease back pain, improve comfort with work and sleeping  OBJECTIVE:  Note: Objective measures were completed at Evaluation unless otherwise noted.  DIAGNOSTIC FINDINGS:  See imaging   PATIENT SURVEYS:  FOTO: 56% function; 63% predicted   COGNITION: Overall cognitive status: Within functional limits for tasks assessed     SENSATION: Light touch: Impaired - R LE  MUSCLE LENGTH: Hamstrings:  Right 35 deg; Left 30 deg [name] test: Right (+); Left (-)  POSTURE: rounded shoulders and increased lumbar lordosis  PALPATION: TTP to bilateral lumbar paraspinals  LUMBAR ROM:   AROM eval  Flexion WFL  Extension 25% reduced with pain  Right lateral flexion   Left lateral flexion   Right rotation   Left rotation    (Blank rows = not tested)  LOWER EXTREMITY MMT:    MMT Right eval Left eval  Hip flexion 3+/5 4/5  Hip extension    Hip abduction 3+/5 4/5  Hip adduction    Hip internal rotation    Hip external rotation    Knee flexion 5/5 4/5  Knee extension 5/5 4/5  Ankle dorsiflexion    Ankle plantarflexion    Ankle inversion    Ankle eversion     (Blank rows = not tested)  LUMBAR SPECIAL TESTS:  Straight leg raise test: Negative and Slump test: Positive  FUNCTIONAL TESTS:  30 Second Sit Stand: 6 reps  90/90 hold: 12 seconds  GAIT: Distance walked: 11ft Assistive device utilized: None Level of assistance: Complete Independence Comments: antalgic gait towards R   TREATMENT: [facility] Adult PT Treatment:                                                DATE: 12/29/2022 Therapeutic Exercise: NuStep lvl 7 UE/LE x 3 min while taking subjective Modified thomas stretch x 60" R Supine sciatic nerve glide 2x15 R Supine PPT x 10 - 5" hold Supine pilates SLR 2x10  S/L hip abd 2x10 Supine  90/90 hold 2x30" S/L clamshell x 15 BTB Bridge 2x10 with blue band  [facility] Adult PT Treatment:                                                DATE: 12/24/2022 Therapeutic Exercise: NuStep lvl 7 UE/LE x 3 min while taking subjective Modified thomas stretch x 60" R Supine sciatic nerve glide 2x15 each Supine PPT x 10 - 5" hold Supine PPT with ball 2x10 Supine pilates SLR 2x10  S/L hip abd 2x10 Supine 90/90 hold 2x30" S/L clamshell x 15 GTB Bridge 2x10  [facility] Adult PT Treatment:                                                DATE: 12/04/2022 Therapeutic Exercise: Seated sciatic nerve glide x 5 R Modified thomas stretch x 60" R Supine PPT x 5 - 5" hold S/L clamshell x 10 RTB Supine SLR x 5 each  PATIENT EDUCATION:  Education details: continue HEP Person educated: Patient Education method: Explanation, Demonstration, and Handouts Education comprehension: verbalized understanding and returned demonstration  HOME EXERCISE PROGRAM: Access Code: [email or web address] URL: [email or web address] Date: 12/24/2022 Prepared by: Edwinna Areola  Exercises - Seated Sciatic Tensioner  - 1 x daily - 7 x weekly - 2 sets - 10 reps - Modified Thomas Stretch  - 1 x daily - 7 x weekly - 2 reps - 60" hold - Supine Posterior Pelvic Tilt  - 1 x daily - 7 x weekly - 2 sets -  10 reps - 5 sec hold - Clamshell with Resistance  - 1 x daily - 7 x weekly - 3 sets - 10 reps - red band hold - Active Straight Leg Raise with Quad Set  - 1 x daily - 7 x weekly - 2 sets - 10 reps - Supine 90/90 Abdominal Bracing  - 1 x daily - 7 x weekly - 2 reps - 30 sec hold  ASSESSMENT:  CLINICAL IMPRESSION: Pt was able to complete all prescribed exercises with no adverse effect. Therapy focused on core/proximal hip strength for decreasing pain and improving mobility. Will continue to progress as able per POC.   EVAL: Patient is a 37 y.o. M who was seen today for physical therapy evaluation and treatment for chronic LBP with  referral into R LE. Physical findings are consistent with referring provider impression as pt demonstrates decrease in core/hip strength and functional mobility. FOTO score shows decreased subjective functional ability below PLOF. Pt would benefit from skilled PT services working on improving strength in core/hip and reducing neural tension.    OBJECTIVE IMPAIRMENTS: Abnormal gait, decreased activity tolerance, decreased endurance, decreased mobility, difficulty walking, decreased ROM, decreased strength, and pain   ACTIVITY LIMITATIONS: carrying, lifting, sitting, standing, squatting, sleeping, stairs, transfers, and locomotion level  PARTICIPATION LIMITATIONS: driving, shopping, community activity, occupation, and yard work  PERSONAL FACTORS: Time since onset of injury/illness/exacerbation and 1 comorbidity: HTN  are also affecting patient's functional outcome.   REHAB POTENTIAL: Good  CLINICAL DECISION MAKING: Stable/uncomplicated  EVALUATION COMPLEXITY: Low   GOALS: Goals reviewed with patient? No  SHORT TERM GOALS: Target date: 12/25/2022   Pt will be compliant and knowledgeable with initial HEP for improved comfort and carryover Baseline: initial HEP given  Goal status: INITIAL  2.  Pt will self report lower back pain no greater than 4/10 for improved comfort and functional ability Baseline: 6/10 at worst Goal status: INITIAL   LONG TERM GOALS: Target date: 01/29/2023   Pt will improve FOTO function score to no less than 63% as proxy for functional improvement with home ADLs, work, and community activites Baseline: 56% function Goal status: INITIAL   2.  Pt will self report lower back pain no greater than 1/10 for improved comfort and functional ability Baseline: 6/10 at worst Goal status: INITIAL   3.  Pt will increase 30 Second Sit to Stand rep count to no less than 10 reps for improved balance, strength, and functional mobility Baseline: 6 reps  Goal status: INITIAL    4.  Pt will improve all LE MMT to no less than 5/5 for improved comfort and functional mobility while decreasing back pain Baseline: see MMT chart Goal status: INITIAL  5.  Pt will increase hold time in 90/90 test to no less than 30 seconds for demonstration of increased core strength and reduction in lower back pain Baseline: 12 seconds Goal status: INITIAL   PLAN:  PT FREQUENCY: 2x/week  PT DURATION: 8 weeks  PLANNED INTERVENTIONS: 97164- PT Re-evaluation, 97110-Therapeutic exercises, 97530- Therapeutic activity, 97112- Neuromuscular re-education, 97535- Self Care, [id number]- Manual therapy, [id number]- Gait training, [id number]- Aquatic Therapy, 97014- Electrical stimulation (unattended), [id number]- Electrical stimulation (manual), 97016- Vasopneumatic device, Dry Needling, Cryotherapy, and Moist heat.  PLAN FOR NEXT SESSION: assess HEP response, core/hip strength, hip flexor stretching  [employer] Authorization   Choose one: Rehabilitative  Standardized Assessment or Functional Outcome Tool: FOTO   Score or Percent Disability: 56% function; 63% predicted   Body Parts Treated (Select each separately):  Lumbopelvic. Overall deficits/functional limitations for body part selected: moderate N/A. Overall deficits/functional limitations for body part selected: N/A N/A. Overall deficits/functional limitations for body part selected: N/A   If treatment provided at initial evaluation, no treatment charged due to lack of authorization.    Eloy End, PT 12/29/2022, 4:31 PM

## 2023-01-05 ENCOUNTER — Ambulatory Visit: Payer: Medicaid Other

## 2023-01-05 DIAGNOSIS — M6281 Muscle weakness (generalized): Secondary | ICD-10-CM

## 2023-01-05 DIAGNOSIS — R2689 Other abnormalities of gait and mobility: Secondary | ICD-10-CM

## 2023-01-05 DIAGNOSIS — M5459 Other low back pain: Secondary | ICD-10-CM

## 2023-01-05 NOTE — Therapy (Signed)
OUTPATIENT PHYSICAL THERAPY TREATMENT   Patient Name: Lee Garcia MRN: [id number] DOB:April 23, 1985, 37 y.o., male Today's Date: 01/06/2023  END OF SESSION:  PT End of Session - 01/05/23 1451     Visit Number 4    Number of Visits 17    Date for PT Re-Evaluation 01/29/23    Authorization Type [employer]    PT Start Time 1449    PT Stop Time 1527    PT Time Calculation (min) 38 min    Activity Tolerance Patient tolerated treatment well    Behavior During Therapy [facility] for tasks assessed/performed                Past Medical History:  Diagnosis Date   Hypertension    Past Surgical History:  Procedure Laterality Date   BACK SURGERY     Patient Active Problem List   Diagnosis Date Noted   Left-sided low back pain without sciatica 10/07/2013   Essential hypertension 10/07/2013   ANXIETY 09/21/2008   PALPITATIONS 09/21/2008    PCP: Vella Kohler, FNP  REFERRING PROVIDER: Newton Pigg, NP  REFERRING DIAG: acute midline thoracic back pain   Rationale for Evaluation and Treatment: Rehabilitation  THERAPY DIAG:  Other low back pain  Muscle weakness (generalized)  Other abnormalities of gait and mobility  ONSET DATE: Chronic  SUBJECTIVE:                                                                                                                                                                                           SUBJECTIVE STATEMENT: Pt presents to PT with continued lower back and R LE symptoms. Has been compliant with HEP with no adverse effect.   PERTINENT HISTORY:  HTN  PAIN:  Are you having pain?  Yes: NPRS scale: 3/10 Worst: 6/10 Pain location: lower back, R anterior thigh Pain description: sharp, tight, N/T Aggravating factors: work related  Relieving factors: medication  PRECAUTIONS: None  RED FLAGS: None   WEIGHT BEARING RESTRICTIONS: No  FALLS:  Has patient fallen in last 6 months? Yes. Number of falls -  one mechanical fall this morning  LIVING ENVIRONMENT: Lives with: lives with their family Lives in: House/apartment  OCCUPATION: Media planner   PLOF: Independent  PATIENT GOALS: decrease back pain, improve comfort with work and sleeping  OBJECTIVE:  Note: Objective measures were completed at Evaluation unless otherwise noted.  DIAGNOSTIC FINDINGS:  See imaging   PATIENT SURVEYS:  FOTO: 56% function; 63% predicted   COGNITION: Overall cognitive status: Within functional limits for tasks assessed     SENSATION: Light touch: Impaired - R LE  MUSCLE LENGTH:  Hamstrings: Right 35 deg; Left 30 deg [name] test: Right (+); Left (-)  POSTURE: rounded shoulders and increased lumbar lordosis  PALPATION: TTP to bilateral lumbar paraspinals  LUMBAR ROM:   AROM eval  Flexion WFL  Extension 25% reduced with pain  Right lateral flexion   Left lateral flexion   Right rotation   Left rotation    (Blank rows = not tested)  LOWER EXTREMITY MMT:    MMT Right eval Left eval  Hip flexion 3+/5 4/5  Hip extension    Hip abduction 3+/5 4/5  Hip adduction    Hip internal rotation    Hip external rotation    Knee flexion 5/5 4/5  Knee extension 5/5 4/5  Ankle dorsiflexion    Ankle plantarflexion    Ankle inversion    Ankle eversion     (Blank rows = not tested)  LUMBAR SPECIAL TESTS:  Straight leg raise test: Negative and Slump test: Positive  FUNCTIONAL TESTS:  30 Second Sit Stand: 6 reps  90/90 hold: 12 seconds  GAIT: Distance walked: 20ft Assistive device utilized: None Level of assistance: Complete Independence Comments: antalgic gait towards R   TREATMENT: [facility] Adult PT Treatment:                                                DATE: 01/05/2023 Therapeutic Exercise: NuStep lvl 7 UE/LE x 3 min while taking subjective Modified thomas stretch x 60" R Supine sciatic nerve glide 2x15 R Supine PPT x 10 - 5" hold Supine pilates SLR 2x15 Supine 90/90 hold  2x30" Supine 90/90 alt heel tap 2x10 Modified crunch with physioball 2x10 - 3" hold Pallof press 2x10 10#  [facility] Adult PT Treatment:                                                DATE: 12/29/2022 Therapeutic Exercise: NuStep lvl 7 UE/LE x 3 min while taking subjective Modified thomas stretch x 60" R Supine sciatic nerve glide 2x15 R Supine PPT x 10 - 5" hold Supine pilates SLR 2x10  S/L hip abd 2x10 Supine 90/90 hold 2x30" S/L clamshell x 15 BTB Bridge 2x10 with blue band  [facility] Adult PT Treatment:                                                DATE: 12/24/2022 Therapeutic Exercise: NuStep lvl 7 UE/LE x 3 min while taking subjective Modified thomas stretch x 60" R Supine sciatic nerve glide 2x15 each Supine PPT x 10 - 5" hold Supine PPT with ball 2x10 Supine pilates SLR 2x10  S/L hip abd 2x10 Supine 90/90 hold 2x30" S/L clamshell x 15 GTB Bridge 2x10  [facility] Adult PT Treatment:                                                DATE: 12/04/2022 Therapeutic Exercise: Seated sciatic nerve glide x 5 R Modified thomas stretch x 60" R Supine  PPT x 5 - 5" hold S/L clamshell x 10 RTB Supine SLR x 5 each  PATIENT EDUCATION:  Education details: continue HEP Person educated: Patient Education method: Explanation, Demonstration, and Handouts Education comprehension: verbalized understanding and returned demonstration  HOME EXERCISE PROGRAM: Access Code: [email or web address] URL: [email or web address] Date: 12/24/2022 Prepared by: Edwinna Areola  Exercises - Seated Sciatic Tensioner  - 1 x daily - 7 x weekly - 2 sets - 10 reps - Modified Thomas Stretch  - 1 x daily - 7 x weekly - 2 reps - 60" hold - Supine Posterior Pelvic Tilt  - 1 x daily - 7 x weekly - 2 sets - 10 reps - 5 sec hold - Clamshell with Resistance  - 1 x daily - 7 x weekly - 3 sets - 10 reps - red band hold - Active Straight Leg Raise with Quad Set  - 1 x daily - 7 x weekly - 2 sets - 10 reps - Supine 90/90  Abdominal Bracing  - 1 x daily - 7 x weekly - 2 reps - 30 sec hold  ASSESSMENT:  CLINICAL IMPRESSION: Pt was able to complete all prescribed exercises with no adverse effect. Therapy focused on core/proximal hip strength for decreasing pain and improving mobility. Will continue to progress as able per POC.   EVAL: Patient is a 37 y.o. M who was seen today for physical therapy evaluation and treatment for chronic LBP with referral into R LE. Physical findings are consistent with referring provider impression as pt demonstrates decrease in core/hip strength and functional mobility. FOTO score shows decreased subjective functional ability below PLOF. Pt would benefit from skilled PT services working on improving strength in core/hip and reducing neural tension.    OBJECTIVE IMPAIRMENTS: Abnormal gait, decreased activity tolerance, decreased endurance, decreased mobility, difficulty walking, decreased ROM, decreased strength, and pain   ACTIVITY LIMITATIONS: carrying, lifting, sitting, standing, squatting, sleeping, stairs, transfers, and locomotion level  PARTICIPATION LIMITATIONS: driving, shopping, community activity, occupation, and yard work  PERSONAL FACTORS: Time since onset of injury/illness/exacerbation and 1 comorbidity: HTN  are also affecting patient's functional outcome.   REHAB POTENTIAL: Good  CLINICAL DECISION MAKING: Stable/uncomplicated  EVALUATION COMPLEXITY: Low   GOALS: Goals reviewed with patient? No  SHORT TERM GOALS: Target date: 12/25/2022   Pt will be compliant and knowledgeable with initial HEP for improved comfort and carryover Baseline: initial HEP given  Goal status: INITIAL  2.  Pt will self report lower back pain no greater than 4/10 for improved comfort and functional ability Baseline: 6/10 at worst Goal status: INITIAL   LONG TERM GOALS: Target date: 01/29/2023   Pt will improve FOTO function score to no less than 63% as proxy for functional  improvement with home ADLs, work, and community activites Baseline: 56% function Goal status: INITIAL   2.  Pt will self report lower back pain no greater than 1/10 for improved comfort and functional ability Baseline: 6/10 at worst Goal status: INITIAL   3.  Pt will increase 30 Second Sit to Stand rep count to no less than 10 reps for improved balance, strength, and functional mobility Baseline: 6 reps  Goal status: INITIAL   4.  Pt will improve all LE MMT to no less than 5/5 for improved comfort and functional mobility while decreasing back pain Baseline: see MMT chart Goal status: INITIAL  5.  Pt will increase hold time in 90/90 test to no less than 30 seconds for demonstration of increased core strength and  reduction in lower back pain Baseline: 12 seconds Goal status: INITIAL   PLAN:  PT FREQUENCY: 2x/week  PT DURATION: 8 weeks  PLANNED INTERVENTIONS: 97164- PT Re-evaluation, 97110-Therapeutic exercises, 97530- Therapeutic activity, [id number]- Neuromuscular re-education, 97535- Self Care, 69629- Manual therapy, [id number]- Gait training, [id number]- Aquatic Therapy, 97014- Electrical stimulation (unattended), [id number]- Electrical stimulation (manual), 97016- Vasopneumatic device, Dry Needling, Cryotherapy, and Moist heat.  PLAN FOR NEXT SESSION: assess HEP response, core/hip strength, hip flexor stretching  [employer] Authorization   Choose one: Rehabilitative  Standardized Assessment or Functional Outcome Tool: FOTO   Score or Percent Disability: 56% function; 63% predicted   Body Parts Treated (Select each separately):  Lumbopelvic. Overall deficits/functional limitations for body part selected: moderate N/A. Overall deficits/functional limitations for body part selected: N/A N/A. Overall deficits/functional limitations for body part selected: N/A   If treatment provided at initial evaluation, no treatment charged due to lack of authorization.    Eloy End, PT 01/06/2023,  7:51 AM

## 2023-01-08 ENCOUNTER — Ambulatory Visit: Payer: Medicaid Other

## 2023-01-08 DIAGNOSIS — M6281 Muscle weakness (generalized): Secondary | ICD-10-CM

## 2023-01-08 DIAGNOSIS — M5459 Other low back pain: Secondary | ICD-10-CM | POA: Diagnosis not present

## 2023-01-08 DIAGNOSIS — R2689 Other abnormalities of gait and mobility: Secondary | ICD-10-CM

## 2023-01-08 NOTE — Therapy (Addendum)
 OUTPATIENT PHYSICAL THERAPY TREATMENT NOTE/DISCHARGE  PHYSICAL THERAPY DISCHARGE SUMMARY  Visits from Start of Care: 5  Current functional level related to goals / functional outcomes: See goals/objective   Remaining deficits: Unable to assess   Education / Equipment: HEP   Patient agrees to discharge. Patient goals were unable to assess. Patient is being discharged due to not returning since the last visit.     Patient Name: Lee Garcia MRN: [id number] DOB:[birth year], 37 y.o., male Today's Date: 01/08/2023  END OF SESSION:  PT End of Session - 01/08/23 1314     Visit Number 5    Number of Visits 17    Date for PT Re-Evaluation 01/29/23    Authorization Type [employer]    Authorization Time Period Approved 10 visits 12/08/22-02/06/23    Authorization - Visit Number 4    Authorization - Number of Visits 10    PT Start Time 1315    PT Stop Time 1358    PT Time Calculation (min) 43 min    Activity Tolerance Patient tolerated treatment well    Behavior During Therapy [facility] for tasks assessed/performed                 Past Medical History:  Diagnosis Date   Hypertension    Past Surgical History:  Procedure Laterality Date   BACK SURGERY     Patient Active Problem List   Diagnosis Date Noted   Left-sided low back pain without sciatica 10/07/2013   Essential hypertension 10/07/2013   ANXIETY 09/21/2008   PALPITATIONS 09/21/2008    PCP: Eva Rocky Browning, FNP  REFERRING PROVIDER: Leita Norse, NP  REFERRING DIAG: acute midline thoracic back pain   Rationale for Evaluation and Treatment: Rehabilitation  THERAPY DIAG:  Other low back pain  Muscle weakness (generalized)  Other abnormalities of gait and mobility  ONSET DATE: Chronic  SUBJECTIVE:                                                                                                                                                                                            SUBJECTIVE STATEMENT: Pt presents to PT with continued lower back and R LE symptoms. Has been compliant with HEP with no adverse effect.   PERTINENT HISTORY:  HTN  PAIN:  Are you having pain?  Yes: NPRS scale: 7/10 Worst: 6/10 Pain location: lower back, R anterior thigh Pain description: sharp, tight, N/T Aggravating factors: work related  Relieving factors: medication  PRECAUTIONS: None  RED FLAGS: None   WEIGHT BEARING RESTRICTIONS: No  FALLS:  Has patient fallen in last 6 months? Yes. Number of falls -  one mechanical fall this morning  LIVING ENVIRONMENT: Lives with: lives with their family Lives in: House/apartment  OCCUPATION: Media planner   PLOF: Independent  PATIENT GOALS: decrease back pain, improve comfort with work and sleeping  OBJECTIVE:  Note: Objective measures were completed at Evaluation unless otherwise noted.  DIAGNOSTIC FINDINGS:  See imaging   PATIENT SURVEYS:  FOTO: 56% function; 63% predicted   COGNITION: Overall cognitive status: Within functional limits for tasks assessed     SENSATION: Light touch: Impaired - R LE  MUSCLE LENGTH: Hamstrings: Right 35 deg; Left 30 deg Thomas test: Right (+); Left (-)  POSTURE: rounded shoulders and increased lumbar lordosis  PALPATION: TTP to bilateral lumbar paraspinals  LUMBAR ROM:   AROM eval  Flexion WFL  Extension 25% reduced with pain  Right lateral flexion   Left lateral flexion   Right rotation   Left rotation    (Blank rows = not tested)  LOWER EXTREMITY MMT:    MMT Right eval Left eval  Hip flexion 3+/5 4/5  Hip extension    Hip abduction 3+/5 4/5  Hip adduction    Hip internal rotation    Hip external rotation    Knee flexion 5/5 4/5  Knee extension 5/5 4/5  Ankle dorsiflexion    Ankle plantarflexion    Ankle inversion    Ankle eversion     (Blank rows = not tested)  LUMBAR SPECIAL TESTS:  Straight leg raise test: Negative and Slump test:  Positive  FUNCTIONAL TESTS:  30 Second Sit Stand: 6 reps  90/90 hold: 12 seconds  GAIT: Distance walked: 66ft Assistive device utilized: None Level of assistance: Complete Independence Comments: antalgic gait towards R   TREATMENT: [facility] Adult PT Treatment:                                                DATE: 01/08/2023 Therapeutic Exercise: NuStep lvl 6 UE/LE x 4 min while taking subjective Modified thomas stretch x 60 R Supine sciatic nerve glide 2x15 R Supine PPT x 10 - 5 hold Supine pilates SLR 2x15 Supine 90/90 hold 2x30 Supine 90/90 alt heel tap 2x10 Bridge with blue band 2x15 Modified side plank 2x20 each Modified crunch with physioball 2x10 - 3 hold Pallof press 2x10 10#  [facility] Adult PT Treatment:                                                DATE: 01/05/2023 Therapeutic Exercise: NuStep lvl 7 UE/LE x 3 min while taking subjective Modified thomas stretch x 60 R Supine sciatic nerve glide 2x15 R Supine PPT x 10 - 5 hold Supine pilates SLR 2x15 Supine 90/90 hold 2x30 Supine 90/90 alt heel tap 2x10 Modified crunch with physioball 2x10 - 3 hold Pallof press 2x10 10#  [facility] Adult PT Treatment:                                                DATE: 12/29/2022 Therapeutic Exercise: NuStep lvl 7 UE/LE x 3 min while taking subjective Modified thomas stretch x 60 R Supine sciatic nerve  glide 2x15 R Supine PPT x 10 - 5 hold Supine pilates SLR 2x10  S/L hip abd 2x10 Supine 90/90 hold 2x30 S/L clamshell x 15 BTB Bridge 2x10 with blue band  [facility] Adult PT Treatment:                                                DATE: 12/24/2022 Therapeutic Exercise: NuStep lvl 7 UE/LE x 3 min while taking subjective Modified thomas stretch x 60 R Supine sciatic nerve glide 2x15 each Supine PPT x 10 - 5 hold Supine PPT with ball 2x10 Supine pilates SLR 2x10  S/L hip abd 2x10 Supine 90/90 hold 2x30 S/L clamshell x 15 GTB Bridge 2x10  [facility] Adult PT Treatment:                                                 DATE: 12/04/2022 Therapeutic Exercise: Seated sciatic nerve glide x 5 R Modified thomas stretch x 60 R Supine PPT x 5 - 5 hold S/L clamshell x 10 RTB Supine SLR x 5 each  PATIENT EDUCATION:  Education details: continue HEP Person educated: Patient Education method: Explanation, Demonstration, and Handouts Education comprehension: verbalized understanding and returned demonstration  HOME EXERCISE PROGRAM: Access Code: [email or web address] URL: [email or web address] Date: 12/24/2022 Prepared by: Alm Kingdom  Exercises - Seated Sciatic Tensioner  - 1 x daily - 7 x weekly - 2 sets - 10 reps - Modified Thomas Stretch  - 1 x daily - 7 x weekly - 2 reps - 60 hold - Supine Posterior Pelvic Tilt  - 1 x daily - 7 x weekly - 2 sets - 10 reps - 5 sec hold - Clamshell with Resistance  - 1 x daily - 7 x weekly - 3 sets - 10 reps - red band hold - Active Straight Leg Raise with Quad Set  - 1 x daily - 7 x weekly - 2 sets - 10 reps - Supine 90/90 Abdominal Bracing  - 1 x daily - 7 x weekly - 2 reps - 30 sec hold  ASSESSMENT:  CLINICAL IMPRESSION: Pt was able to complete all prescribed exercises with no adverse effect. Therapy focused on core/proximal hip strength for decreasing pain and improving mobility. Pt is progressing well overall despite variable R LE pain. Will continue to progress as able per POC.   EVAL: Patient is a 37 y.o. M who was seen today for physical therapy evaluation and treatment for chronic LBP with referral into R LE. Physical findings are consistent with referring provider impression as pt demonstrates decrease in core/hip strength and functional mobility. FOTO score shows decreased subjective functional ability below PLOF. Pt would benefit from skilled PT services working on improving strength in core/hip and reducing neural tension.    OBJECTIVE IMPAIRMENTS: Abnormal gait, decreased activity tolerance, decreased endurance,  decreased mobility, difficulty walking, decreased ROM, decreased strength, and pain   ACTIVITY LIMITATIONS: carrying, lifting, sitting, standing, squatting, sleeping, stairs, transfers, and locomotion level  PARTICIPATION LIMITATIONS: driving, shopping, community activity, occupation, and yard work  PERSONAL FACTORS: Time since onset of injury/illness/exacerbation and 1 comorbidity: HTN are also affecting patient's functional outcome.   REHAB POTENTIAL: Good  CLINICAL DECISION MAKING: Stable/uncomplicated  EVALUATION COMPLEXITY: Low  GOALS: Goals reviewed with patient? No  SHORT TERM GOALS: Target date: 12/25/2022   Pt will be compliant and knowledgeable with initial HEP for improved comfort and carryover Baseline: initial HEP given  Goal status: INITIAL  2.  Pt will self report lower back pain no greater than 4/10 for improved comfort and functional ability Baseline: 6/10 at worst Goal status: INITIAL   LONG TERM GOALS: Target date: 01/29/2023   Pt will improve FOTO function score to no less than 63% as proxy for functional improvement with home ADLs, work, and community activites Baseline: 56% function Goal status: INITIAL   2.  Pt will self report lower back pain no greater than 1/10 for improved comfort and functional ability Baseline: 6/10 at worst Goal status: INITIAL   3.  Pt will increase 30 Second Sit to Stand rep count to no less than 10 reps for improved balance, strength, and functional mobility Baseline: 6 reps  Goal status: INITIAL   4.  Pt will improve all LE MMT to no less than 5/5 for improved comfort and functional mobility while decreasing back pain Baseline: see MMT chart Goal status: INITIAL  5.  Pt will increase hold time in 90/90 test to no less than 30 seconds for demonstration of increased core strength and reduction in lower back pain Baseline: 12 seconds Goal status: INITIAL   PLAN:  PT FREQUENCY: 2x/week  PT DURATION: 8  weeks  PLANNED INTERVENTIONS: 97164- PT Re-evaluation, 97110-Therapeutic exercises, 97530- Therapeutic activity, 97112- Neuromuscular re-education, 97535- Self Care, [id number]- Manual therapy, [id number]- Gait training, [id number]- Aquatic Therapy, 97014- Electrical stimulation (unattended), [id number]- Electrical stimulation (manual), 97016- Vasopneumatic device, Dry Needling, Cryotherapy, and Moist heat.  PLAN FOR NEXT SESSION: assess HEP response, core/hip strength, hip flexor stretching  [employer] Authorization   Choose one: Rehabilitative  Standardized Assessment or Functional Outcome Tool: FOTO   Score or Percent Disability: 56% function; 63% predicted   Body Parts Treated (Select each separately):  Lumbopelvic. Overall deficits/functional limitations for body part selected: moderate N/A. Overall deficits/functional limitations for body part selected: N/A N/A. Overall deficits/functional limitations for body part selected: N/A   If treatment provided at initial evaluation, no treatment charged due to lack of authorization.    Alm JAYSON Kingdom, PT 01/08/2023, 2:03 PM
